# Patient Record
Sex: Female | Born: 1955
Health system: Southern US, Community
[De-identification: ages and names within clinical notes are randomized; demographics above are authoritative.]

## PROBLEM LIST (undated history)

## (undated) DIAGNOSIS — C44722 Squamous cell carcinoma of skin of right lower limb, including hip: Secondary | ICD-10-CM

## (undated) DIAGNOSIS — G459 Transient cerebral ischemic attack, unspecified: Secondary | ICD-10-CM

## (undated) DIAGNOSIS — M549 Dorsalgia, unspecified: Secondary | ICD-10-CM

## (undated) DIAGNOSIS — T7840XA Allergy, unspecified, initial encounter: Secondary | ICD-10-CM

## (undated) DIAGNOSIS — C801 Malignant (primary) neoplasm, unspecified: Secondary | ICD-10-CM

## (undated) DIAGNOSIS — G8929 Other chronic pain: Secondary | ICD-10-CM

## (undated) HISTORY — DX: Allergy, unspecified, initial encounter: T78.40XA

## (undated) HISTORY — PX: CHOLECYSTECTOMY: SHX55

## (undated) HISTORY — PX: FOOT SURGERY: SHX648

## (undated) HISTORY — DX: Squamous cell carcinoma of skin of right lower limb, including hip: C44.722

## (undated) HISTORY — PX: HIP SURGERY: SHX245

## (undated) HISTORY — PX: ABDOMINAL HYSTERECTOMY: SHX81

## (undated) HISTORY — PX: TONSILLECTOMY: SUR1361

## (undated) HISTORY — PX: COLONOSCOPY: SHX174

## (undated) HISTORY — DX: Malignant (primary) neoplasm, unspecified: C80.1

## (undated) HISTORY — PX: WISDOM TOOTH EXTRACTION: SHX21

## (undated) HISTORY — PX: BACK SURGERY: SHX140

---

## 2009-08-17 ENCOUNTER — Emergency Department (HOSPITAL_COMMUNITY): Admission: EM | Admit: 2009-08-17 | Discharge: 2009-08-17 | Payer: Self-pay | Admitting: Emergency Medicine

## 2009-09-09 ENCOUNTER — Encounter: Admission: RE | Admit: 2009-09-09 | Discharge: 2009-09-09 | Payer: Self-pay | Admitting: Neurosurgery

## 2010-05-25 ENCOUNTER — Encounter: Payer: Self-pay | Admitting: Neurosurgery

## 2012-02-10 DIAGNOSIS — M5412 Radiculopathy, cervical region: Secondary | ICD-10-CM

## 2012-02-10 HISTORY — DX: Radiculopathy, cervical region: M54.12

## 2012-04-11 DIAGNOSIS — M25559 Pain in unspecified hip: Secondary | ICD-10-CM

## 2012-04-11 DIAGNOSIS — M48061 Spinal stenosis, lumbar region without neurogenic claudication: Secondary | ICD-10-CM

## 2012-04-11 DIAGNOSIS — M4310 Spondylolisthesis, site unspecified: Secondary | ICD-10-CM

## 2012-04-11 DIAGNOSIS — M549 Dorsalgia, unspecified: Secondary | ICD-10-CM

## 2012-04-11 DIAGNOSIS — IMO0002 Reserved for concepts with insufficient information to code with codable children: Secondary | ICD-10-CM

## 2012-04-11 HISTORY — DX: Pain in unspecified hip: M25.559

## 2012-04-11 HISTORY — DX: Dorsalgia, unspecified: M54.9

## 2012-04-11 HISTORY — DX: Reserved for concepts with insufficient information to code with codable children: IMO0002

## 2012-04-11 HISTORY — DX: Spondylolisthesis, site unspecified: M43.10

## 2012-04-11 HISTORY — DX: Spinal stenosis, lumbar region without neurogenic claudication: M48.061

## 2014-02-27 ENCOUNTER — Emergency Department (HOSPITAL_COMMUNITY)
Admission: EM | Admit: 2014-02-27 | Discharge: 2014-02-28 | Disposition: A | Discharge status: Home / Self Care | Payer: Commercial Managed Care - HMO | Attending: Emergency Medicine | Admitting: Emergency Medicine

## 2014-02-27 ENCOUNTER — Emergency Department (HOSPITAL_COMMUNITY): Payer: Commercial Managed Care - HMO

## 2014-02-27 ENCOUNTER — Encounter (HOSPITAL_COMMUNITY): Payer: Self-pay | Admitting: Emergency Medicine

## 2014-02-27 DIAGNOSIS — Z8673 Personal history of transient ischemic attack (TIA), and cerebral infarction without residual deficits: Secondary | ICD-10-CM | POA: Insufficient documentation

## 2014-02-27 DIAGNOSIS — R0602 Shortness of breath: Secondary | ICD-10-CM

## 2014-02-27 DIAGNOSIS — G8929 Other chronic pain: Secondary | ICD-10-CM | POA: Insufficient documentation

## 2014-02-27 DIAGNOSIS — H538 Other visual disturbances: Secondary | ICD-10-CM | POA: Diagnosis present

## 2014-02-27 DIAGNOSIS — R251 Tremor, unspecified: Secondary | ICD-10-CM | POA: Insufficient documentation

## 2014-02-27 DIAGNOSIS — R6884 Jaw pain: Secondary | ICD-10-CM

## 2014-02-27 DIAGNOSIS — H547 Unspecified visual loss: Secondary | ICD-10-CM | POA: Insufficient documentation

## 2014-02-27 DIAGNOSIS — Z720 Tobacco use: Secondary | ICD-10-CM | POA: Diagnosis not present

## 2014-02-27 HISTORY — DX: Other chronic pain: G89.29

## 2014-02-27 HISTORY — DX: Other chronic pain: M54.9

## 2014-02-27 HISTORY — DX: Transient cerebral ischemic attack, unspecified: G45.9

## 2014-02-27 LAB — URINALYSIS, ROUTINE W REFLEX MICROSCOPIC
Bilirubin Urine: NEGATIVE
GLUCOSE, UA: NEGATIVE mg/dL
Hgb urine dipstick: NEGATIVE
KETONES UR: NEGATIVE mg/dL
LEUKOCYTES UA: NEGATIVE
NITRITE: NEGATIVE
PH: 6 (ref 5.0–8.0)
PROTEIN: NEGATIVE mg/dL
SPECIFIC GRAVITY, URINE: 1.008 (ref 1.005–1.030)
UROBILINOGEN UA: 0.2 mg/dL (ref 0.0–1.0)

## 2014-02-27 LAB — BASIC METABOLIC PANEL
Anion gap: 14 (ref 5–15)
BUN: 12 mg/dL (ref 6–23)
CHLORIDE: 104 meq/L (ref 96–112)
CO2: 25 mEq/L (ref 19–32)
Calcium: 9.8 mg/dL (ref 8.4–10.5)
Creatinine, Ser: 0.63 mg/dL (ref 0.50–1.10)
GFR calc Af Amer: >90 mL/min (ref >90)
GFR calc non Af Amer: >90 mL/min (ref >90)
Glucose, Bld: 101 mg/dL — ABNORMAL HIGH (ref 70–99)
POTASSIUM: 4.5 meq/L (ref 3.7–5.3)
SODIUM: 143 meq/L (ref 137–147)

## 2014-02-27 LAB — CBC
HCT: 43.9 % (ref 36.0–46.0)
Hemoglobin: 15.2 g/dL — ABNORMAL HIGH (ref 12.0–15.0)
MCH: 31.3 pg (ref 26.0–34.0)
MCHC: 34.6 g/dL (ref 30.0–36.0)
MCV: 90.3 fL (ref 78.0–100.0)
PLATELETS: 354 10*3/uL (ref 150–400)
RBC: 4.86 MIL/uL (ref 3.87–5.11)
RDW: 13.2 % (ref 11.5–15.5)
WBC: 9 10*3/uL (ref 4.0–10.5)

## 2014-02-27 NOTE — ED Provider Notes (Signed)
CSN: [id number]     Arrival date & time 02/27/14  1647 History   First MD Initiated Contact with Patient 02/27/14 2011     Chief Complaint  Patient presents with  . Blurred Vision     HPI  Patient presents with concern of blurred vision. Symptoms began 2 weeks ago without clear precipitant. Since onset patient has had bilateral visual disturbance.  There is no ocular pain, nor headache. There is no neck pain, though the patient notes that there is discomfort with jaw opening on the L near the anterior auricular area. No other neck pain. Patient has a Hx of prior TIA - no current anti-platelet meds. Patient also has SOB, though this seems chronic and unchanged.  Patient smokes.  Smoking cessation counselling provided    Past Medical History  Diagnosis Date  . Chronic back pain   . Transient ischemic attack (TIA)    Past Surgical History  Procedure Laterality Date  . Back surgery    . Abdominal hysterectomy    . Foot surgery    . Cholecystectomy     No family history on file. History  Substance Use Topics  . Smoking status: Current Every Day Smoker  . Smokeless tobacco: Not on file  . Alcohol Use: No   OB History   Grav Para Term Preterm Abortions TAB SAB Ect Mult Living                 Review of Systems  Constitutional:       Per HPI, otherwise negative  HENT:       Per HPI, otherwise negative  Eyes: Positive for visual disturbance. Negative for photophobia, pain, discharge, redness and itching.       Husband states that the patient's eyes twitch  Respiratory:       Per HPI, otherwise negative  Cardiovascular:       Per HPI, otherwise negative  Gastrointestinal: Negative for vomiting.  Endocrine:       Negative aside from HPI  Genitourinary:       Neg aside from HPI   Musculoskeletal:       Chronic L inguinal pain - unchanged  Skin: Negative.   Neurological: Positive for tremors. Negative for dizziness, seizures, syncope, facial asymmetry, speech  difficulty, weakness, light-headedness, numbness and headaches.      Allergies  Oxycodone  Home Medications   Prior to Admission medications   Not on File   BP 142/84  Pulse 87  Temp(Src) 98.1 F (36.7 C) (Oral)  Resp 18  Ht 5\' 2"  (1.575 m)  Wt 126 lb (57.153 kg)  BMI 23.04 kg/m2  SpO2 99% Physical Exam  Nursing note and vitals reviewed. Constitutional: She is oriented to person, place, and time. She appears well-developed and well-nourished. No distress.  HENT:  Head: Normocephalic and atraumatic.  Eyes: Conjunctivae are normal. Pupils are equal, round, and reactive to light.  Patient tracks minimally, spontaneously. With directed visual exam, patient has quick development of dizziness.  Horizontal and vertical motion.  Neck:    Cardiovascular: Normal rate and regular rhythm.   Pulmonary/Chest: Effort normal and breath sounds normal. No stridor. No respiratory distress.  Abdominal: She exhibits no distension.  Musculoskeletal: She exhibits no edema.  Neurological: She is alert and oriented to person, place, and time. No cranial nerve deficit.  NIH stroke scale 0. Patient's vision is objectively normal, but she complains of blurriness, difficulty focusing on moving objects  Skin: Skin is warm and dry.  Psychiatric: She has a normal mood and affect.    ED Course  Procedures (including critical care time) Labs Review Labs Reviewed  BASIC METABOLIC PANEL - Abnormal; Notable for the following:    Glucose, Bld 101 (*)    All other components within normal limits  CBC - Abnormal; Notable for the following:    Hemoglobin 15.2 (*)    All other components within normal limits  URINALYSIS, ROUTINE W REFLEX MICROSCOPIC    Imaging Review Dg Chest 2 View  02/27/2014   CLINICAL DATA:  Blurry vision and intermittent shortness of breath for several weeks.  EXAM: CHEST  2 VIEW  COMPARISON:  None.  FINDINGS: The heart size and mediastinal contours are within normal limits.  Both lungs are clear. The visualized skeletal structures are unremarkable.  IMPRESSION: No active cardiopulmonary disease.   Electronically Signed   By: Kerby Moors M.D.   On: 02/27/2014 18:09     EKG Interpretation   Date/Time:  Tuesday February 27 2014 17:19:36 EDT Ventricular Rate:  85 PR Interval:  147 QRS Duration: 94 QT Interval:  365 QTC Calculation: 434 R Axis:   88 Text Interpretation:  Sinus rhythm NORMAL Confirmed by Johnney Killian, MD, Jeannie Done  [phone]) on 02/27/2014 6:05:44 PM     11:47 PM Patient awake and alert, in no distress. No new complaints. Patient and husband aware of all results.  She will follow up with her neurologist and ophthalmology tomorrow. MDM   Final diagnoses:  Visual loss    Patient presents with 1 month of ongoing visual changes.  Patient is awake and alert, in no distress here. Patient also has several ancillary complaints. Patient is awake, alert, neurologically intact, hemodynamically stable, with a largely reassuring physical exam, labs, MRI. Given the patient's evidence of prior stroke, I discussed stroke prophylaxis with her, pending outpatient neurology, ophthalmology follow-up. Patient states that she is intolerant of substantial anticoagulants, but will take 81 mg aspirin daily. Patient discharged in stable condition to follow-up as an outpatient.    Carmin Muskrat, MD 02/27/14 2351

## 2014-02-27 NOTE — Discharge Instructions (Signed)
As discussed, tonight's evaluation has been largely reassuring.  However, your visual disturbance can be considered a work in progress, and additional evaluation is necessary.  Please be sure to speak with your physician and our ophthalmologist tomorrow.  Return here for concerning changes in your condition.  Please add aspirin, 81 mg, daily to your medication regimen.   Blurred Vision You have been seen today complaining of blurred vision. This means you have a loss of ability to see small details.  CAUSES  Blurred vision can be a symptom of underlying eye problems, such as:  Aging of the eye (presbyopia).  Glaucoma.  Cataracts.  Eye infection.  Eye-related migraine.  Diabetes mellitus.  Fatigue.  Migraine headaches.  High blood pressure.  Breakdown of the back of the eye (macular degeneration).  Problems caused by some medications. The most common cause of blurred vision is the need for eyeglasses or a new prescription. Today in the emergency department, no cause for your blurred vision can be found. SYMPTOMS  Blurred vision is the loss of visual sharpness and detail (acuity). DIAGNOSIS  Should blurred vision continue, you should see your caregiver. If your caregiver is your primary care physician, he or she may choose to refer you to another specialist.  TREATMENT  Do not ignore your blurred vision. Make sure to have it checked out to see if further treatment or referral is necessary. SEEK MEDICAL CARE IF:  You are unable to get into a specialist so we can help you with a referral. SEEK IMMEDIATE MEDICAL CARE IF: You have severe eye pain, severe headache, or sudden loss of vision. MAKE SURE YOU:   Understand these instructions.  Will watch your condition.  Will get help right away if you are not doing well or get worse. Document Released: 04/23/2003 Document Revised: 07/13/2011 Document Reviewed: 11/23/2007 [facility] Patient Information 2015 [place], [place]. This  information is not intended to replace advice given to you by your health care provider. Make sure you discuss any questions you have with your health care provider.

## 2014-02-27 NOTE — ED Notes (Signed)
Pt reports blurry vision and intermittent shortness of breath for a "couple of weeks" & c/o pain just under left ear for several months,

## 2014-02-27 NOTE — ED Notes (Signed)
Pt remains in MRI 

## 2014-02-27 NOTE — ED Notes (Signed)
Patient states she has been experiencing blurry, vision, and anxious for the last month. Patient states "for a while", she reports she has pain with touch, knot underneath her left ear.

## 2014-02-27 NOTE — ED Notes (Signed)
Pt transported to MRI 

## 2014-02-27 NOTE — Progress Notes (Signed)
  CARE MANAGEMENT ED NOTE 02/27/2014  Patient:  Nancy Chavez   Account Number:  [id number]  Date Initiated:  02/27/2014  Documentation initiated by:  Nancy Chavez  Subjective/Objective Assessment:   Patient presents to Ed with blurry vision sob for a couple of weeks and pain under left ear     Subjective/Objective Assessment Detail:   Patient with pmhx of TIA     Action/Plan:   Action/Plan Detail:   Anticipated DC Date:       Status Recommendation to Physician:   Result of Recommendation:    Other ED [place]  Other  PCP issues    Choice offered to / List presented to:            Status of service:  Completed, signed off  ED Comments:   ED Comments Detail:  EDCM spoke to patient at bedside.  Patient confirms her pcp is Nancy Chavez at [facility] in [place]. System updated.

## 2014-05-10 DIAGNOSIS — G5601 Carpal tunnel syndrome, right upper limb: Secondary | ICD-10-CM | POA: Diagnosis not present

## 2014-05-10 DIAGNOSIS — G5602 Carpal tunnel syndrome, left upper limb: Secondary | ICD-10-CM | POA: Diagnosis not present

## 2014-05-10 DIAGNOSIS — Z8673 Personal history of transient ischemic attack (TIA), and cerebral infarction without residual deficits: Secondary | ICD-10-CM | POA: Diagnosis not present

## 2014-05-10 DIAGNOSIS — G44209 Tension-type headache, unspecified, not intractable: Secondary | ICD-10-CM | POA: Diagnosis not present

## 2014-05-10 DIAGNOSIS — G4701 Insomnia due to medical condition: Secondary | ICD-10-CM | POA: Diagnosis not present

## 2014-05-10 DIAGNOSIS — M542 Cervicalgia: Secondary | ICD-10-CM | POA: Diagnosis not present

## 2014-05-10 DIAGNOSIS — M5417 Radiculopathy, lumbosacral region: Secondary | ICD-10-CM | POA: Diagnosis not present

## 2014-05-10 DIAGNOSIS — M5412 Radiculopathy, cervical region: Secondary | ICD-10-CM | POA: Diagnosis not present

## 2014-05-18 DIAGNOSIS — M5416 Radiculopathy, lumbar region: Secondary | ICD-10-CM | POA: Diagnosis not present

## 2014-05-18 DIAGNOSIS — Z8673 Personal history of transient ischemic attack (TIA), and cerebral infarction without residual deficits: Secondary | ICD-10-CM | POA: Diagnosis not present

## 2014-05-18 DIAGNOSIS — G44209 Tension-type headache, unspecified, not intractable: Secondary | ICD-10-CM | POA: Diagnosis not present

## 2014-05-18 DIAGNOSIS — G5601 Carpal tunnel syndrome, right upper limb: Secondary | ICD-10-CM | POA: Diagnosis not present

## 2014-05-18 DIAGNOSIS — M5412 Radiculopathy, cervical region: Secondary | ICD-10-CM | POA: Diagnosis not present

## 2014-08-02 DIAGNOSIS — M5412 Radiculopathy, cervical region: Secondary | ICD-10-CM | POA: Diagnosis not present

## 2014-08-02 DIAGNOSIS — G5602 Carpal tunnel syndrome, left upper limb: Secondary | ICD-10-CM | POA: Diagnosis not present

## 2014-08-02 DIAGNOSIS — G5601 Carpal tunnel syndrome, right upper limb: Secondary | ICD-10-CM | POA: Diagnosis not present

## 2014-08-02 DIAGNOSIS — M5417 Radiculopathy, lumbosacral region: Secondary | ICD-10-CM | POA: Diagnosis not present

## 2014-08-02 DIAGNOSIS — Z8673 Personal history of transient ischemic attack (TIA), and cerebral infarction without residual deficits: Secondary | ICD-10-CM | POA: Diagnosis not present

## 2014-09-03 DIAGNOSIS — G5602 Carpal tunnel syndrome, left upper limb: Secondary | ICD-10-CM | POA: Diagnosis not present

## 2014-09-03 DIAGNOSIS — M129 Arthropathy, unspecified: Secondary | ICD-10-CM | POA: Diagnosis not present

## 2014-09-03 DIAGNOSIS — M1811 Unilateral primary osteoarthritis of first carpometacarpal joint, right hand: Secondary | ICD-10-CM

## 2014-09-03 DIAGNOSIS — M189 Osteoarthritis of first carpometacarpal joint, unspecified: Secondary | ICD-10-CM | POA: Diagnosis not present

## 2014-09-03 DIAGNOSIS — M13831 Other specified arthritis, right wrist: Secondary | ICD-10-CM | POA: Diagnosis not present

## 2014-09-03 DIAGNOSIS — F17210 Nicotine dependence, cigarettes, uncomplicated: Secondary | ICD-10-CM | POA: Diagnosis not present

## 2014-09-03 DIAGNOSIS — G5601 Carpal tunnel syndrome, right upper limb: Secondary | ICD-10-CM | POA: Insufficient documentation

## 2014-09-03 DIAGNOSIS — M19031 Primary osteoarthritis, right wrist: Secondary | ICD-10-CM | POA: Diagnosis not present

## 2014-09-03 HISTORY — DX: Carpal tunnel syndrome, right upper limb: G56.01

## 2014-09-03 HISTORY — DX: Unilateral primary osteoarthritis of first carpometacarpal joint, right hand: M18.11

## 2014-09-28 DIAGNOSIS — G5602 Carpal tunnel syndrome, left upper limb: Secondary | ICD-10-CM | POA: Diagnosis not present

## 2014-09-28 DIAGNOSIS — G5601 Carpal tunnel syndrome, right upper limb: Secondary | ICD-10-CM | POA: Diagnosis not present

## 2014-11-14 DIAGNOSIS — Z8673 Personal history of transient ischemic attack (TIA), and cerebral infarction without residual deficits: Secondary | ICD-10-CM | POA: Diagnosis not present

## 2014-11-14 DIAGNOSIS — M5481 Occipital neuralgia: Secondary | ICD-10-CM | POA: Diagnosis not present

## 2014-11-14 DIAGNOSIS — M5416 Radiculopathy, lumbar region: Secondary | ICD-10-CM | POA: Diagnosis not present

## 2014-11-14 DIAGNOSIS — G44209 Tension-type headache, unspecified, not intractable: Secondary | ICD-10-CM | POA: Diagnosis not present

## 2014-11-14 DIAGNOSIS — M5412 Radiculopathy, cervical region: Secondary | ICD-10-CM | POA: Diagnosis not present

## 2014-12-24 DIAGNOSIS — Z8673 Personal history of transient ischemic attack (TIA), and cerebral infarction without residual deficits: Secondary | ICD-10-CM | POA: Insufficient documentation

## 2014-12-24 HISTORY — DX: Personal history of transient ischemic attack (TIA), and cerebral infarction without residual deficits: Z86.73

## 2014-12-26 DIAGNOSIS — Z885 Allergy status to narcotic agent status: Secondary | ICD-10-CM | POA: Diagnosis not present

## 2014-12-26 DIAGNOSIS — M19031 Primary osteoarthritis, right wrist: Secondary | ICD-10-CM | POA: Diagnosis not present

## 2014-12-26 DIAGNOSIS — G5601 Carpal tunnel syndrome, right upper limb: Secondary | ICD-10-CM | POA: Diagnosis not present

## 2014-12-26 DIAGNOSIS — G5602 Carpal tunnel syndrome, left upper limb: Secondary | ICD-10-CM | POA: Diagnosis not present

## 2014-12-26 DIAGNOSIS — F329 Major depressive disorder, single episode, unspecified: Secondary | ICD-10-CM | POA: Diagnosis not present

## 2014-12-26 DIAGNOSIS — F17210 Nicotine dependence, cigarettes, uncomplicated: Secondary | ICD-10-CM | POA: Diagnosis not present

## 2014-12-26 DIAGNOSIS — F419 Anxiety disorder, unspecified: Secondary | ICD-10-CM | POA: Diagnosis not present

## 2014-12-26 DIAGNOSIS — M1811 Unilateral primary osteoarthritis of first carpometacarpal joint, right hand: Secondary | ICD-10-CM | POA: Diagnosis not present

## 2014-12-26 DIAGNOSIS — G8929 Other chronic pain: Secondary | ICD-10-CM | POA: Diagnosis not present

## 2014-12-26 DIAGNOSIS — M549 Dorsalgia, unspecified: Secondary | ICD-10-CM | POA: Diagnosis not present

## 2014-12-31 DIAGNOSIS — M25649 Stiffness of unspecified hand, not elsewhere classified: Secondary | ICD-10-CM | POA: Diagnosis not present

## 2014-12-31 DIAGNOSIS — Z9889 Other specified postprocedural states: Secondary | ICD-10-CM | POA: Diagnosis not present

## 2014-12-31 DIAGNOSIS — M79644 Pain in right finger(s): Secondary | ICD-10-CM | POA: Diagnosis not present

## 2015-01-04 DIAGNOSIS — M5412 Radiculopathy, cervical region: Secondary | ICD-10-CM | POA: Diagnosis not present

## 2015-01-04 DIAGNOSIS — N393 Stress incontinence (female) (male): Secondary | ICD-10-CM | POA: Diagnosis not present

## 2015-01-04 DIAGNOSIS — E782 Mixed hyperlipidemia: Secondary | ICD-10-CM | POA: Diagnosis not present

## 2015-01-04 DIAGNOSIS — M5416 Radiculopathy, lumbar region: Secondary | ICD-10-CM | POA: Diagnosis not present

## 2015-01-04 DIAGNOSIS — Z8673 Personal history of transient ischemic attack (TIA), and cerebral infarction without residual deficits: Secondary | ICD-10-CM | POA: Diagnosis not present

## 2015-01-04 DIAGNOSIS — Z13820 Encounter for screening for osteoporosis: Secondary | ICD-10-CM | POA: Diagnosis not present

## 2015-01-04 DIAGNOSIS — R7309 Other abnormal glucose: Secondary | ICD-10-CM | POA: Diagnosis not present

## 2015-01-04 DIAGNOSIS — Z0000 Encounter for general adult medical examination without abnormal findings: Secondary | ICD-10-CM | POA: Diagnosis not present

## 2015-01-11 DIAGNOSIS — Z1211 Encounter for screening for malignant neoplasm of colon: Secondary | ICD-10-CM | POA: Diagnosis not present

## 2015-01-28 DIAGNOSIS — M1811 Unilateral primary osteoarthritis of first carpometacarpal joint, right hand: Secondary | ICD-10-CM | POA: Diagnosis not present

## 2015-01-28 DIAGNOSIS — M25649 Stiffness of unspecified hand, not elsewhere classified: Secondary | ICD-10-CM | POA: Diagnosis not present

## 2015-01-28 DIAGNOSIS — M85841 Other specified disorders of bone density and structure, right hand: Secondary | ICD-10-CM | POA: Diagnosis not present

## 2015-01-28 DIAGNOSIS — M79644 Pain in right finger(s): Secondary | ICD-10-CM | POA: Diagnosis not present

## 2015-01-28 DIAGNOSIS — Z4789 Encounter for other orthopedic aftercare: Secondary | ICD-10-CM | POA: Diagnosis not present

## 2015-02-13 DIAGNOSIS — M5481 Occipital neuralgia: Secondary | ICD-10-CM | POA: Diagnosis not present

## 2015-02-13 DIAGNOSIS — M5412 Radiculopathy, cervical region: Secondary | ICD-10-CM | POA: Diagnosis not present

## 2015-02-13 DIAGNOSIS — M5441 Lumbago with sciatica, right side: Secondary | ICD-10-CM | POA: Diagnosis not present

## 2015-02-13 DIAGNOSIS — M5442 Lumbago with sciatica, left side: Secondary | ICD-10-CM | POA: Diagnosis not present

## 2015-02-13 DIAGNOSIS — Z8673 Personal history of transient ischemic attack (TIA), and cerebral infarction without residual deficits: Secondary | ICD-10-CM | POA: Diagnosis not present

## 2015-02-13 DIAGNOSIS — G44209 Tension-type headache, unspecified, not intractable: Secondary | ICD-10-CM | POA: Diagnosis not present

## 2015-02-27 DIAGNOSIS — R110 Nausea: Secondary | ICD-10-CM | POA: Diagnosis not present

## 2015-02-27 DIAGNOSIS — R634 Abnormal weight loss: Secondary | ICD-10-CM | POA: Diagnosis not present

## 2015-05-07 DIAGNOSIS — Z780 Asymptomatic menopausal state: Secondary | ICD-10-CM | POA: Diagnosis not present

## 2015-05-07 DIAGNOSIS — N951 Menopausal and female climacteric states: Secondary | ICD-10-CM | POA: Diagnosis not present

## 2015-05-07 DIAGNOSIS — R5383 Other fatigue: Secondary | ICD-10-CM | POA: Diagnosis not present

## 2015-05-07 DIAGNOSIS — L298 Other pruritus: Secondary | ICD-10-CM | POA: Diagnosis not present

## 2015-05-15 DIAGNOSIS — G5601 Carpal tunnel syndrome, right upper limb: Secondary | ICD-10-CM | POA: Diagnosis not present

## 2015-05-15 DIAGNOSIS — M542 Cervicalgia: Secondary | ICD-10-CM | POA: Diagnosis not present

## 2015-05-15 DIAGNOSIS — M5442 Lumbago with sciatica, left side: Secondary | ICD-10-CM | POA: Diagnosis not present

## 2015-05-15 DIAGNOSIS — G5602 Carpal tunnel syndrome, left upper limb: Secondary | ICD-10-CM | POA: Diagnosis not present

## 2015-05-15 DIAGNOSIS — M5441 Lumbago with sciatica, right side: Secondary | ICD-10-CM | POA: Diagnosis not present

## 2015-05-15 DIAGNOSIS — Z8673 Personal history of transient ischemic attack (TIA), and cerebral infarction without residual deficits: Secondary | ICD-10-CM | POA: Diagnosis not present

## 2015-05-15 DIAGNOSIS — M5412 Radiculopathy, cervical region: Secondary | ICD-10-CM | POA: Diagnosis not present

## 2015-06-19 DIAGNOSIS — Z1389 Encounter for screening for other disorder: Secondary | ICD-10-CM | POA: Diagnosis not present

## 2015-06-19 DIAGNOSIS — E1165 Type 2 diabetes mellitus with hyperglycemia: Secondary | ICD-10-CM | POA: Diagnosis not present

## 2015-06-19 DIAGNOSIS — E782 Mixed hyperlipidemia: Secondary | ICD-10-CM | POA: Diagnosis not present

## 2015-06-20 DIAGNOSIS — G4701 Insomnia due to medical condition: Secondary | ICD-10-CM | POA: Diagnosis not present

## 2015-06-20 DIAGNOSIS — G44209 Tension-type headache, unspecified, not intractable: Secondary | ICD-10-CM | POA: Diagnosis not present

## 2015-06-20 DIAGNOSIS — G5601 Carpal tunnel syndrome, right upper limb: Secondary | ICD-10-CM | POA: Diagnosis not present

## 2015-06-20 DIAGNOSIS — G5602 Carpal tunnel syndrome, left upper limb: Secondary | ICD-10-CM | POA: Diagnosis not present

## 2015-06-20 DIAGNOSIS — M5481 Occipital neuralgia: Secondary | ICD-10-CM | POA: Diagnosis not present

## 2015-06-20 DIAGNOSIS — M5412 Radiculopathy, cervical region: Secondary | ICD-10-CM | POA: Diagnosis not present

## 2015-06-20 DIAGNOSIS — Z8673 Personal history of transient ischemic attack (TIA), and cerebral infarction without residual deficits: Secondary | ICD-10-CM | POA: Diagnosis not present

## 2015-06-20 DIAGNOSIS — M5417 Radiculopathy, lumbosacral region: Secondary | ICD-10-CM | POA: Diagnosis not present

## 2015-06-24 DIAGNOSIS — N63 Unspecified lump in breast: Secondary | ICD-10-CM | POA: Diagnosis not present

## 2015-07-10 DIAGNOSIS — R110 Nausea: Secondary | ICD-10-CM | POA: Diagnosis not present

## 2015-07-10 DIAGNOSIS — R634 Abnormal weight loss: Secondary | ICD-10-CM | POA: Diagnosis not present

## 2015-07-12 DIAGNOSIS — M1990 Unspecified osteoarthritis, unspecified site: Secondary | ICD-10-CM | POA: Diagnosis not present

## 2015-07-12 DIAGNOSIS — Z9049 Acquired absence of other specified parts of digestive tract: Secondary | ICD-10-CM | POA: Diagnosis not present

## 2015-07-12 DIAGNOSIS — F17210 Nicotine dependence, cigarettes, uncomplicated: Secondary | ICD-10-CM | POA: Diagnosis not present

## 2015-07-12 DIAGNOSIS — R634 Abnormal weight loss: Secondary | ICD-10-CM | POA: Diagnosis not present

## 2015-07-12 DIAGNOSIS — Z6820 Body mass index (BMI) 20.0-20.9, adult: Secondary | ICD-10-CM | POA: Diagnosis not present

## 2015-07-12 DIAGNOSIS — R110 Nausea: Secondary | ICD-10-CM | POA: Diagnosis not present

## 2015-07-12 DIAGNOSIS — Z8673 Personal history of transient ischemic attack (TIA), and cerebral infarction without residual deficits: Secondary | ICD-10-CM | POA: Diagnosis not present

## 2015-07-12 DIAGNOSIS — Z79899 Other long term (current) drug therapy: Secondary | ICD-10-CM | POA: Diagnosis not present

## 2015-07-12 DIAGNOSIS — Z86010 Personal history of colonic polyps: Secondary | ICD-10-CM | POA: Diagnosis not present

## 2015-07-12 DIAGNOSIS — K449 Diaphragmatic hernia without obstruction or gangrene: Secondary | ICD-10-CM | POA: Diagnosis not present

## 2015-07-17 DIAGNOSIS — M545 Low back pain: Secondary | ICD-10-CM | POA: Diagnosis not present

## 2015-07-17 DIAGNOSIS — K529 Noninfective gastroenteritis and colitis, unspecified: Secondary | ICD-10-CM | POA: Diagnosis not present

## 2015-07-17 DIAGNOSIS — K5730 Diverticulosis of large intestine without perforation or abscess without bleeding: Secondary | ICD-10-CM | POA: Diagnosis not present

## 2015-07-19 DIAGNOSIS — Z1389 Encounter for screening for other disorder: Secondary | ICD-10-CM | POA: Diagnosis not present

## 2015-07-19 DIAGNOSIS — K529 Noninfective gastroenteritis and colitis, unspecified: Secondary | ICD-10-CM | POA: Diagnosis not present

## 2015-07-19 DIAGNOSIS — E1165 Type 2 diabetes mellitus with hyperglycemia: Secondary | ICD-10-CM | POA: Diagnosis not present

## 2015-07-19 DIAGNOSIS — R634 Abnormal weight loss: Secondary | ICD-10-CM | POA: Diagnosis not present

## 2015-07-19 DIAGNOSIS — Z720 Tobacco use: Secondary | ICD-10-CM | POA: Diagnosis not present

## 2015-08-05 DIAGNOSIS — F4323 Adjustment disorder with mixed anxiety and depressed mood: Secondary | ICD-10-CM | POA: Diagnosis not present

## 2015-08-05 DIAGNOSIS — R5383 Other fatigue: Secondary | ICD-10-CM | POA: Diagnosis not present

## 2015-08-05 DIAGNOSIS — R634 Abnormal weight loss: Secondary | ICD-10-CM | POA: Diagnosis not present

## 2015-08-05 DIAGNOSIS — R5381 Other malaise: Secondary | ICD-10-CM | POA: Diagnosis not present

## 2015-08-05 DIAGNOSIS — N390 Urinary tract infection, site not specified: Secondary | ICD-10-CM | POA: Diagnosis not present

## 2015-08-07 DIAGNOSIS — R634 Abnormal weight loss: Secondary | ICD-10-CM | POA: Diagnosis not present

## 2015-08-07 DIAGNOSIS — R110 Nausea: Secondary | ICD-10-CM | POA: Diagnosis not present

## 2015-08-19 DIAGNOSIS — Z720 Tobacco use: Secondary | ICD-10-CM | POA: Diagnosis not present

## 2015-08-19 DIAGNOSIS — R634 Abnormal weight loss: Secondary | ICD-10-CM | POA: Diagnosis not present

## 2015-08-19 DIAGNOSIS — F419 Anxiety disorder, unspecified: Secondary | ICD-10-CM | POA: Diagnosis not present

## 2015-09-26 DIAGNOSIS — Z8673 Personal history of transient ischemic attack (TIA), and cerebral infarction without residual deficits: Secondary | ICD-10-CM | POA: Diagnosis not present

## 2015-09-26 DIAGNOSIS — M542 Cervicalgia: Secondary | ICD-10-CM | POA: Diagnosis not present

## 2015-09-26 DIAGNOSIS — M5481 Occipital neuralgia: Secondary | ICD-10-CM | POA: Diagnosis not present

## 2015-09-26 DIAGNOSIS — M5441 Lumbago with sciatica, right side: Secondary | ICD-10-CM | POA: Diagnosis not present

## 2015-09-26 DIAGNOSIS — M5442 Lumbago with sciatica, left side: Secondary | ICD-10-CM | POA: Diagnosis not present

## 2015-09-26 DIAGNOSIS — G4701 Insomnia due to medical condition: Secondary | ICD-10-CM | POA: Diagnosis not present

## 2015-09-26 DIAGNOSIS — G5603 Carpal tunnel syndrome, bilateral upper limbs: Secondary | ICD-10-CM | POA: Diagnosis not present

## 2015-10-08 DIAGNOSIS — M5136 Other intervertebral disc degeneration, lumbar region: Secondary | ICD-10-CM | POA: Diagnosis not present

## 2015-10-08 DIAGNOSIS — M4806 Spinal stenosis, lumbar region: Secondary | ICD-10-CM | POA: Diagnosis not present

## 2015-11-07 DIAGNOSIS — Z720 Tobacco use: Secondary | ICD-10-CM | POA: Diagnosis not present

## 2015-11-07 DIAGNOSIS — Z780 Asymptomatic menopausal state: Secondary | ICD-10-CM | POA: Diagnosis not present

## 2015-11-07 DIAGNOSIS — K219 Gastro-esophageal reflux disease without esophagitis: Secondary | ICD-10-CM | POA: Diagnosis not present

## 2015-11-07 DIAGNOSIS — F419 Anxiety disorder, unspecified: Secondary | ICD-10-CM | POA: Diagnosis not present

## 2015-11-07 DIAGNOSIS — E782 Mixed hyperlipidemia: Secondary | ICD-10-CM | POA: Diagnosis not present

## 2015-11-07 DIAGNOSIS — E1165 Type 2 diabetes mellitus with hyperglycemia: Secondary | ICD-10-CM | POA: Diagnosis not present

## 2015-11-07 DIAGNOSIS — Z1389 Encounter for screening for other disorder: Secondary | ICD-10-CM | POA: Diagnosis not present

## 2015-12-19 DIAGNOSIS — Z8673 Personal history of transient ischemic attack (TIA), and cerebral infarction without residual deficits: Secondary | ICD-10-CM | POA: Diagnosis not present

## 2015-12-19 DIAGNOSIS — G44209 Tension-type headache, unspecified, not intractable: Secondary | ICD-10-CM | POA: Diagnosis not present

## 2015-12-19 DIAGNOSIS — G5603 Carpal tunnel syndrome, bilateral upper limbs: Secondary | ICD-10-CM | POA: Diagnosis not present

## 2015-12-19 DIAGNOSIS — M542 Cervicalgia: Secondary | ICD-10-CM | POA: Diagnosis not present

## 2015-12-19 DIAGNOSIS — G4701 Insomnia due to medical condition: Secondary | ICD-10-CM | POA: Diagnosis not present

## 2015-12-19 DIAGNOSIS — M5442 Lumbago with sciatica, left side: Secondary | ICD-10-CM | POA: Diagnosis not present

## 2015-12-19 DIAGNOSIS — M5441 Lumbago with sciatica, right side: Secondary | ICD-10-CM | POA: Diagnosis not present

## 2015-12-24 ENCOUNTER — Ambulatory Visit (INDEPENDENT_AMBULATORY_CARE_PROVIDER_SITE_OTHER): Payer: PPO | Admitting: Endocrinology

## 2015-12-24 ENCOUNTER — Encounter: Payer: Self-pay | Admitting: Endocrinology

## 2015-12-24 VITALS — BP 126/70 | HR 85 | Ht 62.0 in | Wt 113.0 lb

## 2015-12-24 DIAGNOSIS — R7303 Prediabetes: Secondary | ICD-10-CM | POA: Diagnosis not present

## 2015-12-24 DIAGNOSIS — R634 Abnormal weight loss: Secondary | ICD-10-CM | POA: Diagnosis not present

## 2015-12-24 DIAGNOSIS — N951 Menopausal and female climacteric states: Secondary | ICD-10-CM | POA: Diagnosis not present

## 2015-12-24 LAB — COMPREHENSIVE METABOLIC PANEL
ALBUMIN: 4.4 g/dL (ref 3.5–5.2)
ALK PHOS: 65 U/L (ref 39–117)
ALT: 22 U/L (ref 0–35)
AST: 13 U/L (ref 0–37)
BUN: 17 mg/dL (ref 6–23)
CHLORIDE: 102 meq/L (ref 96–112)
CO2: 29 mEq/L (ref 19–32)
Calcium: 9.6 mg/dL (ref 8.4–10.5)
Creatinine, Ser: 0.66 mg/dL (ref 0.40–1.20)
GFR: 97.86 mL/min (ref >60.00)
Glucose, Bld: 120 mg/dL — ABNORMAL HIGH (ref 70–99)
POTASSIUM: 4.4 meq/L (ref 3.5–5.1)
SODIUM: 137 meq/L (ref 135–145)
TOTAL PROTEIN: 7.2 g/dL (ref 6.0–8.3)
Total Bilirubin: 0.5 mg/dL (ref 0.2–1.2)

## 2015-12-24 LAB — T4, FREE: FREE T4: 0.93 ng/dL (ref 0.60–1.60)

## 2015-12-24 LAB — TSH: TSH: 0.43 u[IU]/mL (ref 0.35–4.50)

## 2015-12-24 MED ORDER — ESTRADIOL 0.05 MG/24HR TD PTWK: 0.0500 mg | MEDICATED_PATCH | TRANSDERMAL | 1 refills | Status: DC

## 2015-12-24 NOTE — Progress Notes (Signed)
Please let patient know that blood glucose was 120, recommend that she cut back on high-fat foods and simple sugars Thyroid levels are fine, continue follow-up with PCP Please fax to PCP

## 2015-12-24 NOTE — Progress Notes (Signed)
Patient ID: Nancy Chavez, female   DOB: 1955-12-08, 60 y.o.   MRN: [id number]            Chief complaint: Hot flashes  Referring physician: Unk Lightning  History of Present Illness:  Patient has been told to be in the menopause for several years, probably after her oophorectomy in 2009 She initially was given Premarin for a year with control of her heart flashes but not clear why this was stopped.  About 8 months ago she started losing weight and lost about 30 pounds.  She was having excessive amounts of stress and anxiety Her appetite was normal. She has been treated with multiple anxiety and depression drugs with improvement in her symptoms She has started gaining back some weight recently She does not complain of any excessive anxiety now  About 6 months ago or so she was started on hormone supplement that was compounded and absorbed buccally.  This apparently contain estradiol, progesterone, testosterone and DHEA This helped her hot flashes and she felt better overall About 6 weeks ago she was seen by her PCP and since her estradiol level was low she was told to stop the hormone supplement and referred here  Currently the patient is complaining about hot flashes and some nocturnal sweating. She does not have unusual fatigue.  She is able to do her daily routine and yard work without difficulties However fatigue has not completely resolved and she may need to take a nap during the day to rest  ?  DIABETES: She was told to have diabetes and was started on metformin when her A1c was 6.4 in 2/17 although her glucose in January was only 110 nonfasting.  She could not tolerate metformin because of GI side effects Recently her glucose has not been rechecked but her A1c was 6.17/17, similar to her level in 9/16 She had a morning glucose of 109 in 2016, not clear if this was fasting  She was told to use liberal diet because of her weight loss this year    Past Medical History:  Diagnosis  Date  . Chronic back pain   . Transient ischemic attack (TIA)     Past Surgical History:  Procedure Laterality Date  . ABDOMINAL HYSTERECTOMY    . BACK SURGERY    . CHOLECYSTECTOMY    . FOOT SURGERY      No family history on file.  Social History:  reports that she has been smoking.  She does not have any smokeless tobacco history on file. She reports that she uses drugs, including Marijuana. She reports that she does not drink alcohol.  Allergies:  Allergies  Allergen Reactions  . Oxycodone Nausea And Vomiting      Medication List       Accurate as of 12/24/15  9:34 AM. Always use your most recent med list.          amitriptyline 25 MG tablet Commonly known as:  ELAVIL Take 75 mg by mouth at bedtime.   atorvastatin 20 MG tablet Commonly known as:  LIPITOR Take 20 mg by mouth daily.   clonazePAM 2 MG tablet Commonly known as:  KLONOPIN Take 2 mg by mouth at bedtime.   diclofenac 75 MG EC tablet Commonly known as:  VOLTAREN Take 75 mg by mouth 2 (two) times daily.   escitalopram 20 MG tablet Commonly known as:  LEXAPRO Take 20 mg by mouth daily.   gabapentin 600 MG tablet Commonly known as:  NEURONTIN Take 600  mg by mouth 3 (three) times daily.   HYDROcodone-acetaminophen 10-325 MG tablet Commonly known as:  NORCO Take 1 tablet by mouth every 6 (six) hours as needed for moderate pain.   ibuprofen 200 MG tablet Commonly known as:  ADVIL,MOTRIN Take 800 mg by mouth every 6 (six) hours as needed for moderate pain.   multivitamin with minerals Tabs tablet Take 1 tablet by mouth daily.   omeprazole 20 MG capsule Commonly known as:  PRILOSEC Take 20 mg by mouth daily.   vitamin A 10000 UNIT capsule Take 10,000 Units by mouth daily.   VITAMIN K (PHYTONADIONE) PO Take 1 tablet by mouth daily.       LABS:  No visits with results within 1 Week(s) from this visit.  Latest known visit with results is:  Admission on 02/27/2014, Discharged on  02/28/2014  Component Date Value Ref Range Status  . Sodium 02/27/2014 143  137 - 147 mEq/L Final  . Potassium 02/27/2014 4.5  3.7 - 5.3 mEq/L Final  . Chloride 02/27/2014 104  96 - 112 mEq/L Final  . CO2 02/27/2014 25  19 - 32 mEq/L Final  . Glucose, Bld 02/27/2014 101* 70 - 99 mg/dL Final  . BUN 02/27/2014 12  6 - 23 mg/dL Final  . Creatinine, Ser 02/27/2014 0.63  0.50 - 1.10 mg/dL Final  . Calcium 02/27/2014 9.8  8.4 - 10.5 mg/dL Final  . GFR calc non Af Amer 02/27/2014 >90  >90 mL/min Final  . GFR calc Af Amer 02/27/2014 >90  >90 mL/min Final   Comment: (NOTE)                          The eGFR has been calculated using the CKD EPI equation.                          This calculation has not been validated in all clinical situations.                          eGFR's persistently <90 mL/min signify possible Chronic Kidney                          Disease.  . Anion gap 02/27/2014 14  5 - 15 Final  . Color, Urine 02/27/2014 YELLOW  YELLOW Final  . APPearance 02/27/2014 CLEAR  CLEAR Final  . Specific Gravity, Urine 02/27/2014 1.008  1.005 - 1.030 Final  . pH 02/27/2014 6.0  5.0 - 8.0 Final  . Glucose, UA 02/27/2014 NEGATIVE  NEGATIVE mg/dL Final  . Hgb urine dipstick 02/27/2014 NEGATIVE  NEGATIVE Final  . Bilirubin Urine 02/27/2014 NEGATIVE  NEGATIVE Final  . Ketones, ur 02/27/2014 NEGATIVE  NEGATIVE mg/dL Final  . Protein, ur 02/27/2014 NEGATIVE  NEGATIVE mg/dL Final  . Urobilinogen, UA 02/27/2014 0.2  0.0 - 1.0 mg/dL Final  . Nitrite 02/27/2014 NEGATIVE  NEGATIVE Final  . Leukocytes, UA 02/27/2014 NEGATIVE  NEGATIVE Final  . WBC 02/27/2014 9.0  4.0 - 10.5 K/uL Final  . RBC 02/27/2014 4.86  3.87 - 5.11 MIL/uL Final  . Hemoglobin 02/27/2014 15.2* 12.0 - 15.0 g/dL Final  . HCT 02/27/2014 43.9  36.0 - 46.0 % Final  . MCV 02/27/2014 90.3  78.0 - 100.0 fL Final  . MCH 02/27/2014 31.3  26.0 - 34.0 pg Final  . MCHC 02/27/2014 34.6  30.0 -  36.0 g/dL Final  . RDW 02/27/2014 13.2  11.5 -  15.5 % Final  . Platelets 02/27/2014 354  150 - 400 K/uL Final        Review of Systems  Constitutional: Positive for weight loss. Negative for reduced appetite.  HENT: Negative for trouble swallowing.   Gastrointestinal: Negative for vomiting and diarrhea.  Endocrine: Positive for fatigue and heat intolerance. Negative for light-headedness.       Has had decreased energy level, recently improving  Musculoskeletal: Positive for back pain.  Skin: Negative for rash.  Neurological: Negative for weakness.  Psychiatric/Behavioral: Positive for nervousness.     PHYSICAL EXAM:  BP 126/70   Pulse 85   Ht 5' 2"  (1.575 m)   Wt 113 lb (51.3 kg)   SpO2 96%   BMI 20.67 kg/m   GENERAL:  Averagely built and nourished, pleasant, not unusually anxious or hyperkinetic  No pallor, clubbing, lymphadenopathy or edema.   Skin:  no rash .  Diffuse tan present  Eyes: Externally normal. Fundii:  normal discs and vessels.  ENT: Oral mucosa and tongue normal.  THYROID:  Not palpable.  HEART:  Normal  S1 and S2; no murmur or click.  CHEST:  Normal shape.  Lungs: Vescicular breath sounds heard equally.  No crepitations/ wheeze.  ABDOMEN:  No distention.  Liver and spleen not palpable.  No other mass or tenderness.  NEUROLOGICAL: .Reflexes are normal bilaterally at biceps and ankles.  Mild tremor of outstretched hands present  JOINTS:  Normal.   ASSESSMENT:    Postmenopausal hot flashes, currently not on HRT, needs a physiological estradiol preparation and does not need to be on multiple hormones.  Prefer transdermal estradiol versus oral  History of weight loss.  Although this is proving she still is significantly below her previous weight and has mild tachycardia and tremor today on exam  Prediabetes with mild increase in A1c not in diabetic range and glucose levels not clearly abnormal   PLAN:    Trial of Climara patch 0.05 mg weekly for symptomatic control of hot flashes.  The  dose may need to be titrated  She can follow up with PCP for follow-up prescriptions  Check glucose and thyroid functions today  Nancy Chavez 12/24/2015, 9:34 AM

## 2015-12-26 ENCOUNTER — Other Ambulatory Visit: Payer: Self-pay

## 2015-12-26 ENCOUNTER — Telehealth: Payer: Self-pay | Admitting: Endocrinology

## 2015-12-26 ENCOUNTER — Telehealth: Payer: Self-pay

## 2015-12-26 MED ORDER — ESTRADIOL 0.05 MG/24HR TD PTWK: 0.0500 mg | MEDICATED_PATCH | TRANSDERMAL | 1 refills | Status: DC

## 2015-12-26 NOTE — Telephone Encounter (Signed)
Sent lab results to PCP, and rx to patient preferred pharmacy.

## 2015-12-26 NOTE — Telephone Encounter (Signed)
Prescription for Climara resubmitted to the [facility] on [address] in [place].

## 2015-12-26 NOTE — Telephone Encounter (Signed)
PT pharmacy has not received the Estrogen Patch. She stated that it needs to be sent into [facility] on [address].

## 2016-04-02 DIAGNOSIS — Z23 Encounter for immunization: Secondary | ICD-10-CM | POA: Diagnosis not present

## 2016-04-02 DIAGNOSIS — Z780 Asymptomatic menopausal state: Secondary | ICD-10-CM | POA: Diagnosis not present

## 2016-04-15 DIAGNOSIS — M5442 Lumbago with sciatica, left side: Secondary | ICD-10-CM | POA: Diagnosis not present

## 2016-04-15 DIAGNOSIS — M5441 Lumbago with sciatica, right side: Secondary | ICD-10-CM | POA: Diagnosis not present

## 2016-04-15 DIAGNOSIS — M542 Cervicalgia: Secondary | ICD-10-CM | POA: Diagnosis not present

## 2016-04-15 DIAGNOSIS — G44209 Tension-type headache, unspecified, not intractable: Secondary | ICD-10-CM | POA: Diagnosis not present

## 2016-04-15 DIAGNOSIS — Z8673 Personal history of transient ischemic attack (TIA), and cerebral infarction without residual deficits: Secondary | ICD-10-CM | POA: Diagnosis not present

## 2016-04-15 DIAGNOSIS — M5481 Occipital neuralgia: Secondary | ICD-10-CM | POA: Diagnosis not present

## 2016-04-15 DIAGNOSIS — R251 Tremor, unspecified: Secondary | ICD-10-CM | POA: Diagnosis not present

## 2016-05-04 DIAGNOSIS — R569 Unspecified convulsions: Secondary | ICD-10-CM

## 2016-05-04 HISTORY — DX: Unspecified convulsions: R56.9

## 2016-05-13 DIAGNOSIS — M545 Low back pain: Secondary | ICD-10-CM | POA: Diagnosis not present

## 2016-05-13 DIAGNOSIS — Z981 Arthrodesis status: Secondary | ICD-10-CM

## 2016-05-13 DIAGNOSIS — M5126 Other intervertebral disc displacement, lumbar region: Secondary | ICD-10-CM | POA: Diagnosis not present

## 2016-05-13 HISTORY — DX: Arthrodesis status: Z98.1

## 2016-05-20 IMAGING — MR MR HEAD W/O CM
8 of 11 series · 36 of 48 positions shown · non-contrast
Comparison: CT of the head January 07, 2010

CLINICAL DATA: Visual loss and headache for 1 month, LEFT posterior
ear pain for longer.

EXAM:
MRI HEAD WITHOUT CONTRAST
TECHNIQUE: Multiplanar, multiecho pulse sequences of the brain and surrounding
structures were obtained without intravenous contrast.

[Series 4: DWI · axial · 5.0mm · 1.09mm/px · z∈[-40,+103]mm · 8 of 60 slices shown (1 of 4)]
[im 1/60]
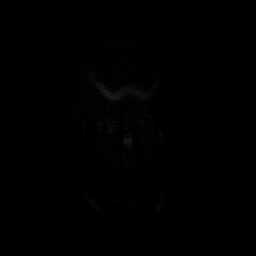
[im 9/60]
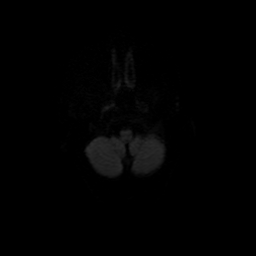
[im 17/60]
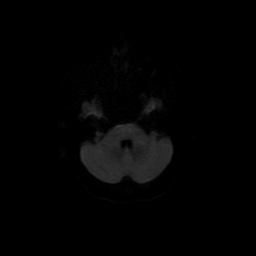
[im 26/60]
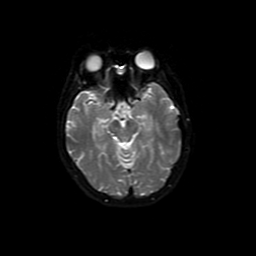
[im 34/60]
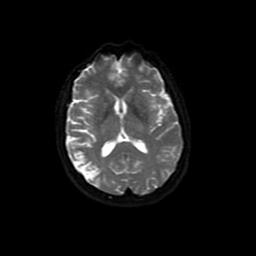
[im 43/60]
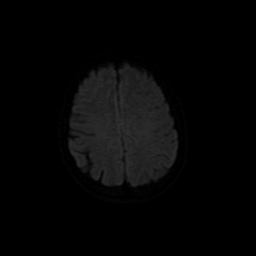
[im 51/60]
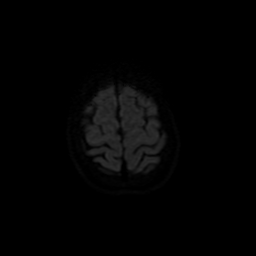
[im 60/60]
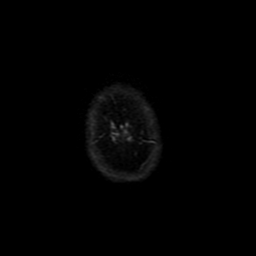

[Series 5: DWI · coronal · 5.0mm · 1.09mm/px · 9 of 68 slices shown (2 of 4)]
[im 1/68]
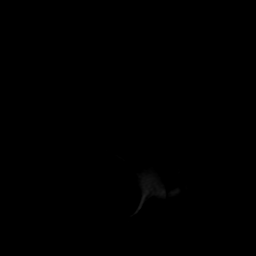
[im 9/68]
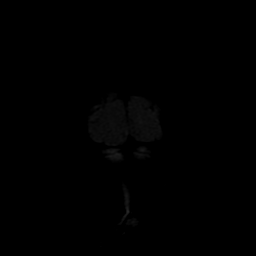
[im 17/68]
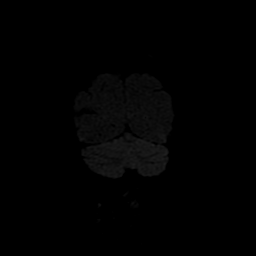
[im 26/68]
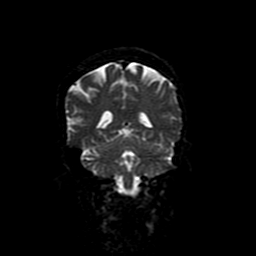
[im 34/68]
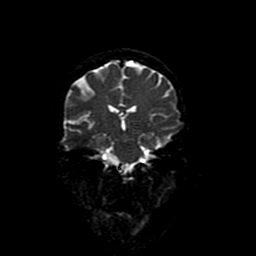
[im 42/68]
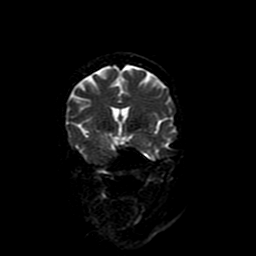
[im 51/68]
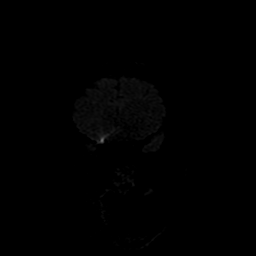
[im 59/68]
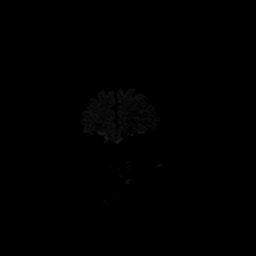
[im 68/68]
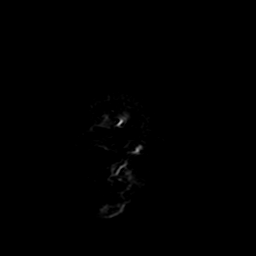

[Series 6: T2 · axial · 5.0mm · 0.43mm/px · z∈[-29,+105]mm · 3 of 22 slices shown (1 of 2)]
[im 1/22]
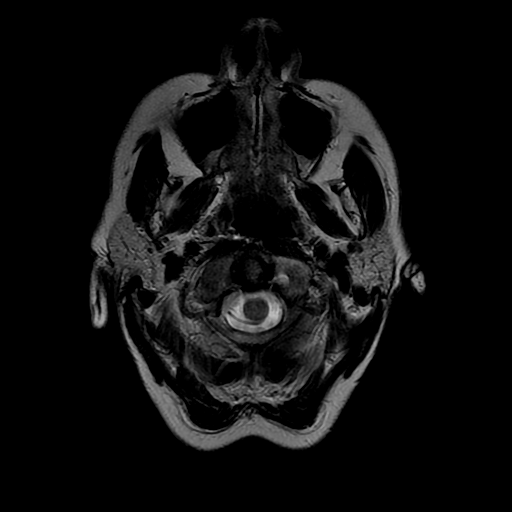
[im 11/22]
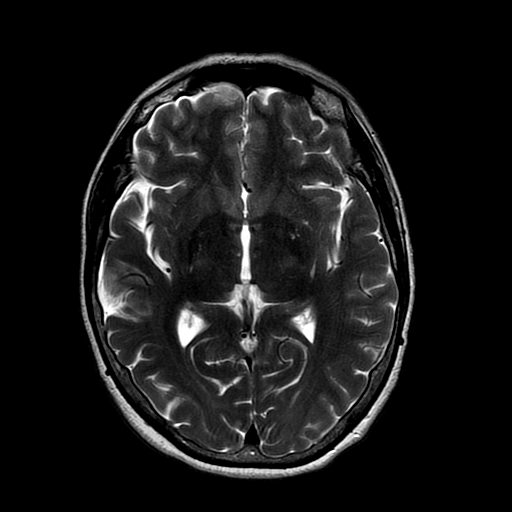
[im 22/22]
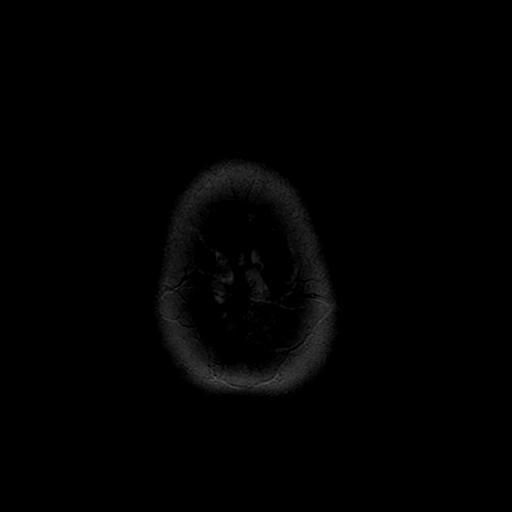

[Series 7: FLAIR · axial · 5.0mm · 0.43mm/px · z∈[-34,+110]mm · 3 of 22 slices shown]
[im 1/22]
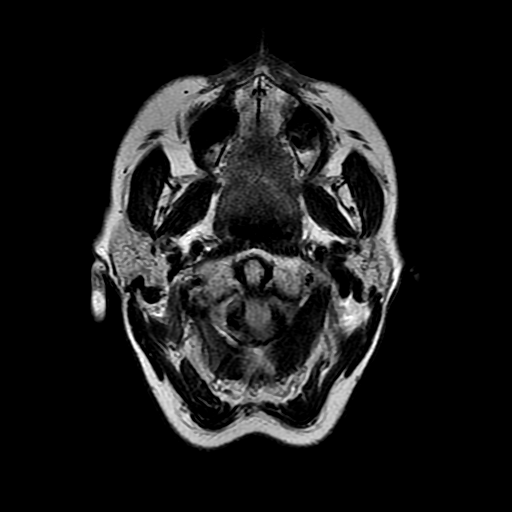
[im 11/22]
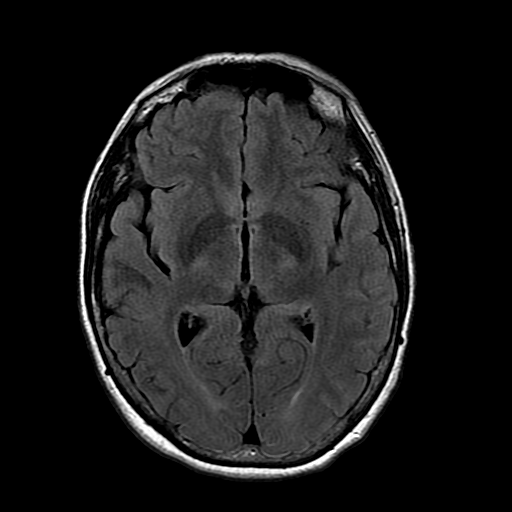
[im 22/22]
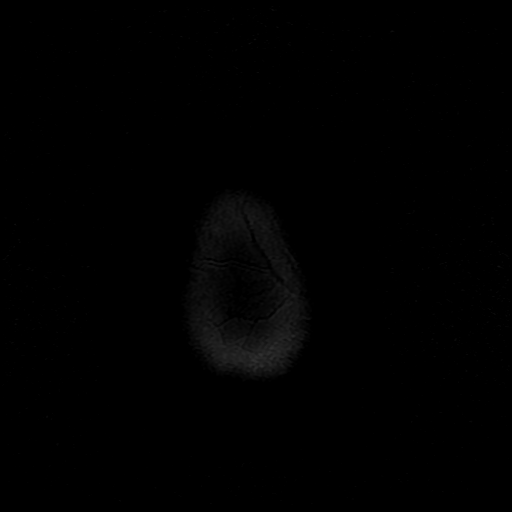

[Series 10: T2 · coronal · 5.0mm · 0.45mm/px · 3 of 25 slices shown (2 of 2)]
[im 1/25]
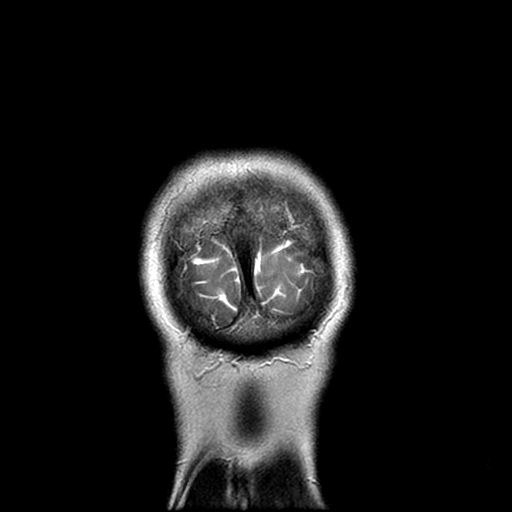
[im 13/25]
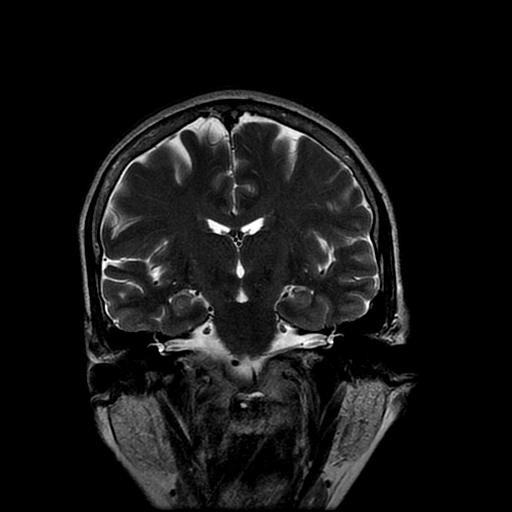
[im 25/25]
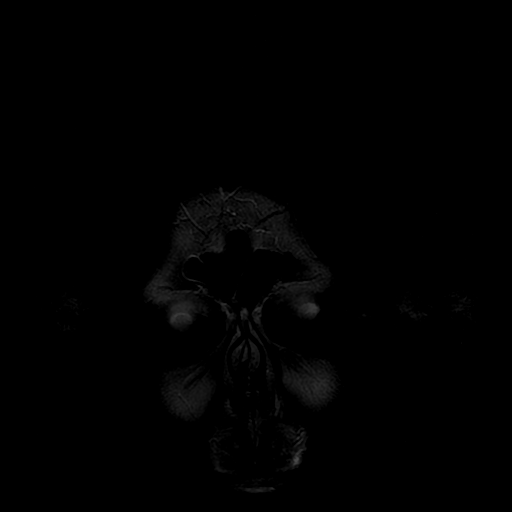

[Series 12: T2 fat-sat · axial · 3.0mm · 0.35mm/px · z∈[-29,+22]mm · 2 of 18 slices shown]
[im 1/18]
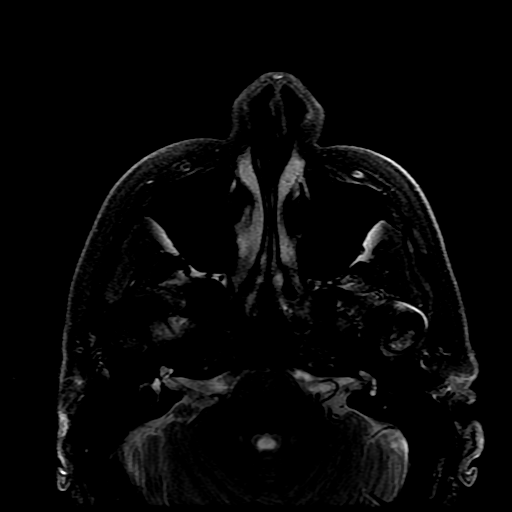
[im 18/18]
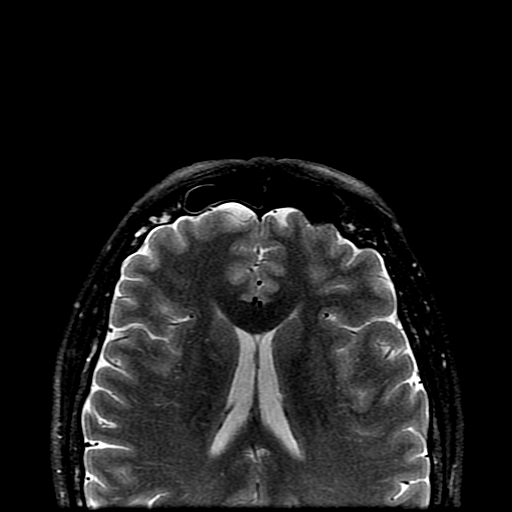

[Series 400: DWI · axial · 5.0mm · 1.09mm/px · z∈[-40,+103]mm · 4 of 30 slices shown (3 of 4)]
[im 1/30]
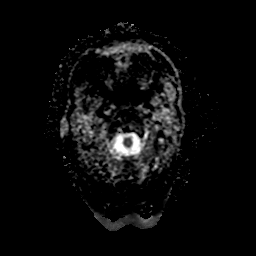
[im 10/30]
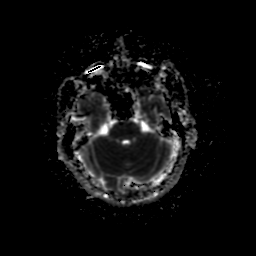
[im 20/30]
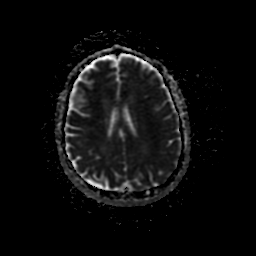
[im 30/30]
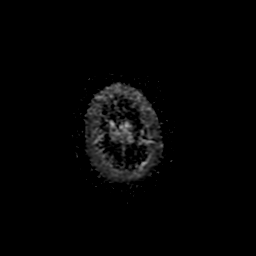

[Series 500: DWI · coronal · 5.0mm · 1.09mm/px · 4 of 34 slices shown (4 of 4)]
[im 1/34]
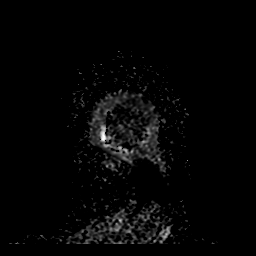
[im 12/34]
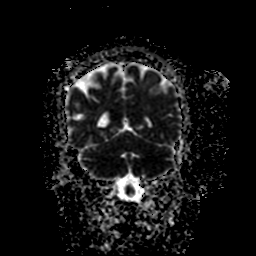
[im 23/34]
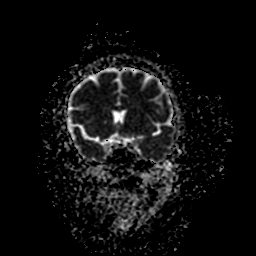
[im 34/34]
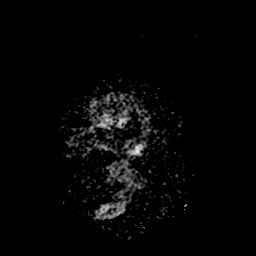

[36 of 48 positions shown; findings below may reference images not displayed]

FINDINGS: No reduced diffusion to suggest acute ischemia. Trace susceptibility
artifact within the RIGHT parietal lobe cortical encephalomalacia.
No susceptibility artifact to suggest hemorrhage. The ventricles
sulci are overall normal for patient's age. No midline shift or mass
effect.

Normal major intracranial vascular flow voids seen at the skull
base. No abnormal extra-axial fluid collections; mildly expansile T2
bright signal inferior to the RIGHT cerebellar pontine angle
scallops the calvarium, unchanged and suggests arachnoid cyst
without associated reduced diffusion. Cyst extends into the
hypoglossal canal on the RIGHT.

No abnormal sellar expansion. No cerebellar tonsillar ectopia.
Severe mid cervical disc degeneration and reversed lordosis. Ocular
globes and orbital contents are unremarkable. Severe
temporomandibular osteoarthrosis. Trace ethmoid mucosal thickening
without paranasal sinus air-fluid levels. Mastoid air cells are well
aerated. No posterior auricular fluid collections or masses on this
nonenhanced examination.
IMPRESSION: No acute intracranial process or specific findings to explain vision
loss.

RIGHT parietal encephalomalacia suggest remote middle cerebral
artery territory infarct, unchanged.

RIGHT inferior cerebellar pontine angle stable arachnoid cyst.

  By: Nazareth Jumper

## 2016-07-02 DIAGNOSIS — M5481 Occipital neuralgia: Secondary | ICD-10-CM | POA: Diagnosis not present

## 2016-07-02 DIAGNOSIS — Z8673 Personal history of transient ischemic attack (TIA), and cerebral infarction without residual deficits: Secondary | ICD-10-CM | POA: Diagnosis not present

## 2016-07-02 DIAGNOSIS — M5442 Lumbago with sciatica, left side: Secondary | ICD-10-CM | POA: Diagnosis not present

## 2016-07-02 DIAGNOSIS — M5441 Lumbago with sciatica, right side: Secondary | ICD-10-CM | POA: Diagnosis not present

## 2016-07-02 DIAGNOSIS — M542 Cervicalgia: Secondary | ICD-10-CM | POA: Diagnosis not present

## 2016-07-02 DIAGNOSIS — R251 Tremor, unspecified: Secondary | ICD-10-CM | POA: Diagnosis not present

## 2016-07-02 DIAGNOSIS — G4701 Insomnia due to medical condition: Secondary | ICD-10-CM | POA: Diagnosis not present

## 2016-08-21 DIAGNOSIS — Z79899 Other long term (current) drug therapy: Secondary | ICD-10-CM | POA: Diagnosis not present

## 2016-08-21 DIAGNOSIS — T887XXS Unspecified adverse effect of drug or medicament, sequela: Secondary | ICD-10-CM | POA: Diagnosis not present

## 2016-08-21 DIAGNOSIS — R0789 Other chest pain: Secondary | ICD-10-CM | POA: Diagnosis not present

## 2016-08-21 DIAGNOSIS — Z0000 Encounter for general adult medical examination without abnormal findings: Secondary | ICD-10-CM | POA: Diagnosis not present

## 2016-08-21 DIAGNOSIS — E1165 Type 2 diabetes mellitus with hyperglycemia: Secondary | ICD-10-CM | POA: Diagnosis not present

## 2016-08-21 DIAGNOSIS — Z780 Asymptomatic menopausal state: Secondary | ICD-10-CM | POA: Diagnosis not present

## 2016-08-21 DIAGNOSIS — F419 Anxiety disorder, unspecified: Secondary | ICD-10-CM | POA: Diagnosis not present

## 2016-08-21 DIAGNOSIS — R3129 Other microscopic hematuria: Secondary | ICD-10-CM | POA: Diagnosis not present

## 2016-08-21 DIAGNOSIS — Z8673 Personal history of transient ischemic attack (TIA), and cerebral infarction without residual deficits: Secondary | ICD-10-CM | POA: Diagnosis not present

## 2016-08-21 DIAGNOSIS — E782 Mixed hyperlipidemia: Secondary | ICD-10-CM | POA: Diagnosis not present

## 2016-08-28 DIAGNOSIS — R55 Syncope and collapse: Secondary | ICD-10-CM | POA: Diagnosis not present

## 2016-08-28 DIAGNOSIS — R319 Hematuria, unspecified: Secondary | ICD-10-CM | POA: Diagnosis not present

## 2016-08-28 DIAGNOSIS — N281 Cyst of kidney, acquired: Secondary | ICD-10-CM | POA: Diagnosis not present

## 2016-08-28 DIAGNOSIS — R51 Headache: Secondary | ICD-10-CM | POA: Diagnosis not present

## 2016-09-24 DIAGNOSIS — G44209 Tension-type headache, unspecified, not intractable: Secondary | ICD-10-CM | POA: Diagnosis not present

## 2016-09-24 DIAGNOSIS — R55 Syncope and collapse: Secondary | ICD-10-CM | POA: Diagnosis not present

## 2016-09-24 DIAGNOSIS — R251 Tremor, unspecified: Secondary | ICD-10-CM | POA: Diagnosis not present

## 2016-09-24 DIAGNOSIS — M542 Cervicalgia: Secondary | ICD-10-CM | POA: Diagnosis not present

## 2016-09-24 DIAGNOSIS — M5442 Lumbago with sciatica, left side: Secondary | ICD-10-CM | POA: Diagnosis not present

## 2016-09-24 DIAGNOSIS — M5441 Lumbago with sciatica, right side: Secondary | ICD-10-CM | POA: Diagnosis not present

## 2016-09-24 DIAGNOSIS — G4701 Insomnia due to medical condition: Secondary | ICD-10-CM | POA: Diagnosis not present

## 2016-10-07 DIAGNOSIS — R55 Syncope and collapse: Secondary | ICD-10-CM | POA: Diagnosis not present

## 2016-11-19 DIAGNOSIS — Z8673 Personal history of transient ischemic attack (TIA), and cerebral infarction without residual deficits: Secondary | ICD-10-CM | POA: Diagnosis not present

## 2016-11-19 DIAGNOSIS — R5381 Other malaise: Secondary | ICD-10-CM | POA: Diagnosis not present

## 2016-11-19 DIAGNOSIS — R5383 Other fatigue: Secondary | ICD-10-CM | POA: Diagnosis not present

## 2016-11-19 DIAGNOSIS — M5442 Lumbago with sciatica, left side: Secondary | ICD-10-CM | POA: Diagnosis not present

## 2016-11-19 DIAGNOSIS — F419 Anxiety disorder, unspecified: Secondary | ICD-10-CM | POA: Diagnosis not present

## 2016-11-19 DIAGNOSIS — K219 Gastro-esophageal reflux disease without esophagitis: Secondary | ICD-10-CM | POA: Diagnosis not present

## 2016-11-19 DIAGNOSIS — R251 Tremor, unspecified: Secondary | ICD-10-CM | POA: Diagnosis not present

## 2016-11-19 DIAGNOSIS — E782 Mixed hyperlipidemia: Secondary | ICD-10-CM | POA: Diagnosis not present

## 2016-11-19 DIAGNOSIS — M542 Cervicalgia: Secondary | ICD-10-CM | POA: Diagnosis not present

## 2016-11-19 DIAGNOSIS — G4701 Insomnia due to medical condition: Secondary | ICD-10-CM | POA: Diagnosis not present

## 2016-11-19 DIAGNOSIS — M5441 Lumbago with sciatica, right side: Secondary | ICD-10-CM | POA: Diagnosis not present

## 2016-11-19 DIAGNOSIS — R3129 Other microscopic hematuria: Secondary | ICD-10-CM | POA: Diagnosis not present

## 2016-11-19 DIAGNOSIS — R55 Syncope and collapse: Secondary | ICD-10-CM | POA: Diagnosis not present

## 2016-11-19 DIAGNOSIS — N951 Menopausal and female climacteric states: Secondary | ICD-10-CM | POA: Diagnosis not present

## 2016-12-09 DIAGNOSIS — R3121 Asymptomatic microscopic hematuria: Secondary | ICD-10-CM | POA: Insufficient documentation

## 2016-12-09 HISTORY — DX: Asymptomatic microscopic hematuria: R31.21

## 2016-12-11 DIAGNOSIS — R310 Gross hematuria: Secondary | ICD-10-CM | POA: Diagnosis not present

## 2016-12-11 DIAGNOSIS — R3121 Asymptomatic microscopic hematuria: Secondary | ICD-10-CM | POA: Diagnosis not present

## 2016-12-13 DIAGNOSIS — N200 Calculus of kidney: Secondary | ICD-10-CM | POA: Insufficient documentation

## 2016-12-13 DIAGNOSIS — N281 Cyst of kidney, acquired: Secondary | ICD-10-CM | POA: Insufficient documentation

## 2016-12-13 HISTORY — DX: Cyst of kidney, acquired: N28.1

## 2016-12-13 HISTORY — DX: Calculus of kidney: N20.0

## 2016-12-14 DIAGNOSIS — R310 Gross hematuria: Secondary | ICD-10-CM | POA: Diagnosis not present

## 2016-12-14 DIAGNOSIS — N200 Calculus of kidney: Secondary | ICD-10-CM | POA: Diagnosis not present

## 2016-12-14 DIAGNOSIS — N281 Cyst of kidney, acquired: Secondary | ICD-10-CM | POA: Diagnosis not present

## 2016-12-14 DIAGNOSIS — F17210 Nicotine dependence, cigarettes, uncomplicated: Secondary | ICD-10-CM | POA: Diagnosis not present

## 2017-01-28 DIAGNOSIS — M5442 Lumbago with sciatica, left side: Secondary | ICD-10-CM | POA: Diagnosis not present

## 2017-01-28 DIAGNOSIS — R251 Tremor, unspecified: Secondary | ICD-10-CM | POA: Diagnosis not present

## 2017-01-28 DIAGNOSIS — Z8673 Personal history of transient ischemic attack (TIA), and cerebral infarction without residual deficits: Secondary | ICD-10-CM | POA: Diagnosis not present

## 2017-01-28 DIAGNOSIS — M542 Cervicalgia: Secondary | ICD-10-CM | POA: Diagnosis not present

## 2017-01-28 DIAGNOSIS — M5441 Lumbago with sciatica, right side: Secondary | ICD-10-CM | POA: Diagnosis not present

## 2017-01-28 DIAGNOSIS — G4701 Insomnia due to medical condition: Secondary | ICD-10-CM | POA: Diagnosis not present

## 2017-02-09 DIAGNOSIS — D2371 Other benign neoplasm of skin of right lower limb, including hip: Secondary | ICD-10-CM | POA: Diagnosis not present

## 2017-02-09 DIAGNOSIS — L918 Other hypertrophic disorders of the skin: Secondary | ICD-10-CM | POA: Diagnosis not present

## 2017-02-09 DIAGNOSIS — C44722 Squamous cell carcinoma of skin of right lower limb, including hip: Secondary | ICD-10-CM | POA: Diagnosis not present

## 2017-02-09 DIAGNOSIS — L578 Other skin changes due to chronic exposure to nonionizing radiation: Secondary | ICD-10-CM | POA: Diagnosis not present

## 2017-02-11 DIAGNOSIS — G5603 Carpal tunnel syndrome, bilateral upper limbs: Secondary | ICD-10-CM | POA: Diagnosis not present

## 2017-02-11 DIAGNOSIS — M5417 Radiculopathy, lumbosacral region: Secondary | ICD-10-CM | POA: Diagnosis not present

## 2017-02-11 DIAGNOSIS — M5412 Radiculopathy, cervical region: Secondary | ICD-10-CM | POA: Diagnosis not present

## 2017-02-11 DIAGNOSIS — G44209 Tension-type headache, unspecified, not intractable: Secondary | ICD-10-CM | POA: Diagnosis not present

## 2017-02-11 DIAGNOSIS — R251 Tremor, unspecified: Secondary | ICD-10-CM | POA: Diagnosis not present

## 2017-02-11 DIAGNOSIS — Z8673 Personal history of transient ischemic attack (TIA), and cerebral infarction without residual deficits: Secondary | ICD-10-CM | POA: Diagnosis not present

## 2017-02-18 DIAGNOSIS — M5412 Radiculopathy, cervical region: Secondary | ICD-10-CM | POA: Diagnosis not present

## 2017-02-18 DIAGNOSIS — M542 Cervicalgia: Secondary | ICD-10-CM | POA: Diagnosis not present

## 2017-02-18 DIAGNOSIS — M50221 Other cervical disc displacement at C4-C5 level: Secondary | ICD-10-CM | POA: Diagnosis not present

## 2017-02-18 DIAGNOSIS — M47812 Spondylosis without myelopathy or radiculopathy, cervical region: Secondary | ICD-10-CM | POA: Diagnosis not present

## 2017-02-22 DIAGNOSIS — C44722 Squamous cell carcinoma of skin of right lower limb, including hip: Secondary | ICD-10-CM | POA: Diagnosis not present

## 2017-03-17 DIAGNOSIS — L578 Other skin changes due to chronic exposure to nonionizing radiation: Secondary | ICD-10-CM | POA: Diagnosis not present

## 2017-03-17 DIAGNOSIS — L219 Seborrheic dermatitis, unspecified: Secondary | ICD-10-CM | POA: Diagnosis not present

## 2017-03-17 DIAGNOSIS — L570 Actinic keratosis: Secondary | ICD-10-CM | POA: Diagnosis not present

## 2017-03-17 DIAGNOSIS — L821 Other seborrheic keratosis: Secondary | ICD-10-CM | POA: Diagnosis not present

## 2017-04-08 DIAGNOSIS — M5441 Lumbago with sciatica, right side: Secondary | ICD-10-CM | POA: Diagnosis not present

## 2017-04-08 DIAGNOSIS — M542 Cervicalgia: Secondary | ICD-10-CM | POA: Diagnosis not present

## 2017-04-08 DIAGNOSIS — R4702 Dysphasia: Secondary | ICD-10-CM | POA: Diagnosis not present

## 2017-04-08 DIAGNOSIS — Z79899 Other long term (current) drug therapy: Secondary | ICD-10-CM | POA: Diagnosis not present

## 2017-04-08 DIAGNOSIS — G5603 Carpal tunnel syndrome, bilateral upper limbs: Secondary | ICD-10-CM | POA: Diagnosis not present

## 2017-04-08 DIAGNOSIS — M5442 Lumbago with sciatica, left side: Secondary | ICD-10-CM | POA: Diagnosis not present

## 2017-04-28 DIAGNOSIS — I639 Cerebral infarction, unspecified: Secondary | ICD-10-CM | POA: Diagnosis not present

## 2017-04-28 DIAGNOSIS — R079 Chest pain, unspecified: Secondary | ICD-10-CM | POA: Diagnosis not present

## 2017-04-28 DIAGNOSIS — R109 Unspecified abdominal pain: Secondary | ICD-10-CM | POA: Diagnosis not present

## 2017-04-28 DIAGNOSIS — M546 Pain in thoracic spine: Secondary | ICD-10-CM | POA: Diagnosis not present

## 2017-04-28 DIAGNOSIS — R51 Headache: Secondary | ICD-10-CM | POA: Diagnosis not present

## 2017-04-28 DIAGNOSIS — R1310 Dysphagia, unspecified: Secondary | ICD-10-CM | POA: Diagnosis not present

## 2017-04-28 DIAGNOSIS — H538 Other visual disturbances: Secondary | ICD-10-CM | POA: Diagnosis not present

## 2017-05-19 DIAGNOSIS — R1310 Dysphagia, unspecified: Secondary | ICD-10-CM | POA: Insufficient documentation

## 2017-05-19 HISTORY — DX: Dysphagia, unspecified: R13.10

## 2017-05-20 DIAGNOSIS — Z79890 Hormone replacement therapy (postmenopausal): Secondary | ICD-10-CM | POA: Diagnosis not present

## 2017-05-20 DIAGNOSIS — F419 Anxiety disorder, unspecified: Secondary | ICD-10-CM | POA: Diagnosis not present

## 2017-06-16 DIAGNOSIS — G44209 Tension-type headache, unspecified, not intractable: Secondary | ICD-10-CM | POA: Diagnosis not present

## 2017-06-16 DIAGNOSIS — R251 Tremor, unspecified: Secondary | ICD-10-CM | POA: Diagnosis not present

## 2017-06-16 DIAGNOSIS — G4701 Insomnia due to medical condition: Secondary | ICD-10-CM | POA: Diagnosis not present

## 2017-06-16 DIAGNOSIS — M5442 Lumbago with sciatica, left side: Secondary | ICD-10-CM | POA: Diagnosis not present

## 2017-06-16 DIAGNOSIS — M542 Cervicalgia: Secondary | ICD-10-CM | POA: Diagnosis not present

## 2017-06-16 DIAGNOSIS — Z8673 Personal history of transient ischemic attack (TIA), and cerebral infarction without residual deficits: Secondary | ICD-10-CM | POA: Diagnosis not present

## 2017-06-16 DIAGNOSIS — M5441 Lumbago with sciatica, right side: Secondary | ICD-10-CM | POA: Diagnosis not present

## 2017-06-17 DIAGNOSIS — Z79890 Hormone replacement therapy (postmenopausal): Secondary | ICD-10-CM | POA: Insufficient documentation

## 2017-06-17 HISTORY — DX: Hormone replacement therapy: Z79.890

## 2017-07-03 DIAGNOSIS — F419 Anxiety disorder, unspecified: Secondary | ICD-10-CM | POA: Diagnosis not present

## 2017-07-03 DIAGNOSIS — R51 Headache: Secondary | ICD-10-CM | POA: Diagnosis not present

## 2017-07-03 DIAGNOSIS — F19239 Other psychoactive substance dependence with withdrawal, unspecified: Secondary | ICD-10-CM | POA: Diagnosis not present

## 2017-07-03 DIAGNOSIS — F1320 Sedative, hypnotic or anxiolytic dependence, uncomplicated: Secondary | ICD-10-CM | POA: Diagnosis not present

## 2017-07-03 DIAGNOSIS — H538 Other visual disturbances: Secondary | ICD-10-CM | POA: Diagnosis not present

## 2017-07-12 DIAGNOSIS — F19939 Other psychoactive substance use, unspecified with withdrawal, unspecified: Secondary | ICD-10-CM

## 2017-07-12 DIAGNOSIS — F1320 Sedative, hypnotic or anxiolytic dependence, uncomplicated: Secondary | ICD-10-CM | POA: Insufficient documentation

## 2017-07-12 DIAGNOSIS — F19239 Other psychoactive substance dependence with withdrawal, unspecified: Secondary | ICD-10-CM

## 2017-07-12 HISTORY — DX: Sedative, hypnotic or anxiolytic dependence, uncomplicated: F13.20

## 2017-07-12 HISTORY — DX: Other psychoactive substance use, unspecified with withdrawal, unspecified: F19.939

## 2017-07-12 HISTORY — DX: Other psychoactive substance dependence with withdrawal, unspecified: F19.239

## 2017-07-13 DIAGNOSIS — F1320 Sedative, hypnotic or anxiolytic dependence, uncomplicated: Secondary | ICD-10-CM | POA: Diagnosis not present

## 2017-07-13 DIAGNOSIS — F19939 Other psychoactive substance use, unspecified with withdrawal, unspecified: Secondary | ICD-10-CM | POA: Diagnosis not present

## 2017-07-20 DIAGNOSIS — F1320 Sedative, hypnotic or anxiolytic dependence, uncomplicated: Secondary | ICD-10-CM | POA: Diagnosis not present

## 2017-07-20 DIAGNOSIS — G4700 Insomnia, unspecified: Secondary | ICD-10-CM

## 2017-07-20 DIAGNOSIS — F419 Anxiety disorder, unspecified: Secondary | ICD-10-CM | POA: Insufficient documentation

## 2017-07-20 HISTORY — DX: Insomnia, unspecified: G47.00

## 2017-07-20 HISTORY — DX: Anxiety disorder, unspecified: F41.9

## 2017-07-29 DIAGNOSIS — G4700 Insomnia, unspecified: Secondary | ICD-10-CM | POA: Diagnosis not present

## 2017-07-29 DIAGNOSIS — R1013 Epigastric pain: Secondary | ICD-10-CM | POA: Insufficient documentation

## 2017-07-29 HISTORY — DX: Epigastric pain: R10.13

## 2017-08-10 DIAGNOSIS — F4329 Adjustment disorder with other symptoms: Secondary | ICD-10-CM

## 2017-08-10 DIAGNOSIS — E041 Nontoxic single thyroid nodule: Secondary | ICD-10-CM

## 2017-08-10 DIAGNOSIS — G8929 Other chronic pain: Secondary | ICD-10-CM

## 2017-08-10 DIAGNOSIS — Z780 Asymptomatic menopausal state: Secondary | ICD-10-CM

## 2017-08-10 DIAGNOSIS — M542 Cervicalgia: Secondary | ICD-10-CM

## 2017-08-10 DIAGNOSIS — M5416 Radiculopathy, lumbar region: Secondary | ICD-10-CM

## 2017-08-10 DIAGNOSIS — M5412 Radiculopathy, cervical region: Secondary | ICD-10-CM | POA: Insufficient documentation

## 2017-08-10 DIAGNOSIS — Z860100 Personal history of colon polyps, unspecified: Secondary | ICD-10-CM

## 2017-08-10 DIAGNOSIS — N393 Stress incontinence (female) (male): Secondary | ICD-10-CM

## 2017-08-10 DIAGNOSIS — E785 Hyperlipidemia, unspecified: Secondary | ICD-10-CM | POA: Insufficient documentation

## 2017-08-10 DIAGNOSIS — K219 Gastro-esophageal reflux disease without esophagitis: Secondary | ICD-10-CM

## 2017-08-10 DIAGNOSIS — K449 Diaphragmatic hernia without obstruction or gangrene: Secondary | ICD-10-CM

## 2017-08-10 DIAGNOSIS — F4323 Adjustment disorder with mixed anxiety and depressed mood: Secondary | ICD-10-CM | POA: Insufficient documentation

## 2017-08-10 DIAGNOSIS — E1165 Type 2 diabetes mellitus with hyperglycemia: Secondary | ICD-10-CM

## 2017-08-10 DIAGNOSIS — R7309 Other abnormal glucose: Secondary | ICD-10-CM

## 2017-08-10 DIAGNOSIS — K5790 Diverticulosis of intestine, part unspecified, without perforation or abscess without bleeding: Secondary | ICD-10-CM

## 2017-08-10 DIAGNOSIS — E782 Mixed hyperlipidemia: Secondary | ICD-10-CM | POA: Insufficient documentation

## 2017-08-10 DIAGNOSIS — Z720 Tobacco use: Secondary | ICD-10-CM

## 2017-08-10 DIAGNOSIS — I6350 Cerebral infarction due to unspecified occlusion or stenosis of unspecified cerebral artery: Secondary | ICD-10-CM

## 2017-08-10 DIAGNOSIS — K529 Noninfective gastroenteritis and colitis, unspecified: Secondary | ICD-10-CM

## 2017-08-10 HISTORY — DX: Noninfective gastroenteritis and colitis, unspecified: K52.9

## 2017-08-10 HISTORY — DX: Cerebral infarction due to unspecified occlusion or stenosis of unspecified cerebral artery: I63.50

## 2017-08-10 HISTORY — DX: Asymptomatic menopausal state: Z78.0

## 2017-08-10 HISTORY — DX: Gastro-esophageal reflux disease without esophagitis: K21.9

## 2017-08-10 HISTORY — DX: Hypercalcemia: E83.52

## 2017-08-10 HISTORY — DX: Mixed hyperlipidemia: E78.2

## 2017-08-10 HISTORY — DX: Other abnormal glucose: R73.09

## 2017-08-10 HISTORY — DX: Diaphragmatic hernia without obstruction or gangrene: K44.9

## 2017-08-10 HISTORY — DX: Other chronic pain: G89.29

## 2017-08-10 HISTORY — DX: Type 2 diabetes mellitus with hyperglycemia: E11.65

## 2017-08-10 HISTORY — DX: Stress incontinence (female) (male): N39.3

## 2017-08-10 HISTORY — DX: Personal history of colonic polyps: Z86.010

## 2017-08-10 HISTORY — DX: Radiculopathy, lumbar region: M54.16

## 2017-08-10 HISTORY — DX: Nontoxic single thyroid nodule: E04.1

## 2017-08-10 HISTORY — DX: Adjustment disorder with mixed anxiety and depressed mood: F43.23

## 2017-08-10 HISTORY — DX: Tobacco use: Z72.0

## 2017-08-10 HISTORY — DX: Diverticulosis of intestine, part unspecified, without perforation or abscess without bleeding: K57.90

## 2017-08-10 HISTORY — DX: Personal history of colon polyps, unspecified: Z86.0100

## 2017-08-10 HISTORY — DX: Cervicalgia: M54.2

## 2017-08-10 HISTORY — DX: Adjustment disorder with other symptoms: F43.29

## 2017-08-12 DIAGNOSIS — M5442 Lumbago with sciatica, left side: Secondary | ICD-10-CM | POA: Diagnosis not present

## 2017-08-12 DIAGNOSIS — M5441 Lumbago with sciatica, right side: Secondary | ICD-10-CM | POA: Diagnosis not present

## 2017-08-12 DIAGNOSIS — Z79899 Other long term (current) drug therapy: Secondary | ICD-10-CM | POA: Diagnosis not present

## 2017-08-12 DIAGNOSIS — G4700 Insomnia, unspecified: Secondary | ICD-10-CM | POA: Diagnosis not present

## 2017-08-12 DIAGNOSIS — G4701 Insomnia due to medical condition: Secondary | ICD-10-CM | POA: Diagnosis not present

## 2017-08-12 DIAGNOSIS — F419 Anxiety disorder, unspecified: Secondary | ICD-10-CM | POA: Diagnosis not present

## 2017-08-12 DIAGNOSIS — R251 Tremor, unspecified: Secondary | ICD-10-CM | POA: Diagnosis not present

## 2017-08-12 DIAGNOSIS — Z79890 Hormone replacement therapy (postmenopausal): Secondary | ICD-10-CM | POA: Diagnosis not present

## 2017-08-12 DIAGNOSIS — M542 Cervicalgia: Secondary | ICD-10-CM | POA: Diagnosis not present

## 2017-08-12 DIAGNOSIS — R1013 Epigastric pain: Secondary | ICD-10-CM | POA: Diagnosis not present

## 2017-08-12 DIAGNOSIS — R002 Palpitations: Secondary | ICD-10-CM | POA: Diagnosis not present

## 2017-08-12 DIAGNOSIS — R079 Chest pain, unspecified: Secondary | ICD-10-CM | POA: Diagnosis not present

## 2017-09-14 DIAGNOSIS — R110 Nausea: Secondary | ICD-10-CM | POA: Diagnosis not present

## 2017-09-14 DIAGNOSIS — Z780 Asymptomatic menopausal state: Secondary | ICD-10-CM | POA: Diagnosis not present

## 2017-09-14 DIAGNOSIS — G4700 Insomnia, unspecified: Secondary | ICD-10-CM | POA: Diagnosis not present

## 2017-09-14 DIAGNOSIS — F4323 Adjustment disorder with mixed anxiety and depressed mood: Secondary | ICD-10-CM | POA: Diagnosis not present

## 2017-10-07 DIAGNOSIS — M542 Cervicalgia: Secondary | ICD-10-CM | POA: Diagnosis not present

## 2017-10-07 DIAGNOSIS — G5603 Carpal tunnel syndrome, bilateral upper limbs: Secondary | ICD-10-CM | POA: Diagnosis not present

## 2017-10-07 DIAGNOSIS — M5441 Lumbago with sciatica, right side: Secondary | ICD-10-CM | POA: Diagnosis not present

## 2017-10-07 DIAGNOSIS — R251 Tremor, unspecified: Secondary | ICD-10-CM | POA: Diagnosis not present

## 2017-10-07 DIAGNOSIS — M5442 Lumbago with sciatica, left side: Secondary | ICD-10-CM | POA: Diagnosis not present

## 2017-10-07 DIAGNOSIS — R634 Abnormal weight loss: Secondary | ICD-10-CM | POA: Diagnosis not present

## 2017-10-07 DIAGNOSIS — G44209 Tension-type headache, unspecified, not intractable: Secondary | ICD-10-CM | POA: Diagnosis not present

## 2017-10-25 DIAGNOSIS — Z9049 Acquired absence of other specified parts of digestive tract: Secondary | ICD-10-CM | POA: Diagnosis not present

## 2017-10-25 DIAGNOSIS — R634 Abnormal weight loss: Secondary | ICD-10-CM | POA: Diagnosis not present

## 2017-10-25 DIAGNOSIS — R918 Other nonspecific abnormal finding of lung field: Secondary | ICD-10-CM | POA: Diagnosis not present

## 2017-10-25 DIAGNOSIS — R911 Solitary pulmonary nodule: Secondary | ICD-10-CM | POA: Diagnosis not present

## 2017-10-25 DIAGNOSIS — R110 Nausea: Secondary | ICD-10-CM | POA: Diagnosis not present

## 2017-11-29 DIAGNOSIS — F17200 Nicotine dependence, unspecified, uncomplicated: Secondary | ICD-10-CM | POA: Diagnosis not present

## 2017-11-29 DIAGNOSIS — Z79899 Other long term (current) drug therapy: Secondary | ICD-10-CM | POA: Diagnosis not present

## 2017-11-29 DIAGNOSIS — Z6820 Body mass index (BMI) 20.0-20.9, adult: Secondary | ICD-10-CM | POA: Diagnosis not present

## 2017-11-29 DIAGNOSIS — Z1331 Encounter for screening for depression: Secondary | ICD-10-CM | POA: Diagnosis not present

## 2017-11-29 DIAGNOSIS — E782 Mixed hyperlipidemia: Secondary | ICD-10-CM | POA: Diagnosis not present

## 2017-11-29 DIAGNOSIS — Z1339 Encounter for screening examination for other mental health and behavioral disorders: Secondary | ICD-10-CM | POA: Diagnosis not present

## 2017-11-29 DIAGNOSIS — K219 Gastro-esophageal reflux disease without esophagitis: Secondary | ICD-10-CM | POA: Diagnosis not present

## 2017-11-29 DIAGNOSIS — G4700 Insomnia, unspecified: Secondary | ICD-10-CM | POA: Diagnosis not present

## 2017-11-29 DIAGNOSIS — R634 Abnormal weight loss: Secondary | ICD-10-CM | POA: Diagnosis not present

## 2017-12-15 DIAGNOSIS — E782 Mixed hyperlipidemia: Secondary | ICD-10-CM | POA: Diagnosis not present

## 2017-12-15 DIAGNOSIS — R634 Abnormal weight loss: Secondary | ICD-10-CM | POA: Diagnosis not present

## 2017-12-15 DIAGNOSIS — Z7901 Long term (current) use of anticoagulants: Secondary | ICD-10-CM | POA: Diagnosis not present

## 2017-12-15 DIAGNOSIS — Z0001 Encounter for general adult medical examination with abnormal findings: Secondary | ICD-10-CM | POA: Diagnosis not present

## 2017-12-15 DIAGNOSIS — R238 Other skin changes: Secondary | ICD-10-CM | POA: Diagnosis not present

## 2017-12-15 DIAGNOSIS — R946 Abnormal results of thyroid function studies: Secondary | ICD-10-CM | POA: Diagnosis not present

## 2017-12-15 DIAGNOSIS — Z6820 Body mass index (BMI) 20.0-20.9, adult: Secondary | ICD-10-CM | POA: Diagnosis not present

## 2017-12-21 DIAGNOSIS — E042 Nontoxic multinodular goiter: Secondary | ICD-10-CM | POA: Diagnosis not present

## 2017-12-21 DIAGNOSIS — E041 Nontoxic single thyroid nodule: Secondary | ICD-10-CM | POA: Diagnosis not present

## 2017-12-21 DIAGNOSIS — R946 Abnormal results of thyroid function studies: Secondary | ICD-10-CM | POA: Diagnosis not present

## 2017-12-30 DIAGNOSIS — M542 Cervicalgia: Secondary | ICD-10-CM | POA: Diagnosis not present

## 2017-12-30 DIAGNOSIS — R251 Tremor, unspecified: Secondary | ICD-10-CM | POA: Diagnosis not present

## 2017-12-30 DIAGNOSIS — M5481 Occipital neuralgia: Secondary | ICD-10-CM | POA: Diagnosis not present

## 2017-12-30 DIAGNOSIS — R202 Paresthesia of skin: Secondary | ICD-10-CM | POA: Diagnosis not present

## 2017-12-30 DIAGNOSIS — M5442 Lumbago with sciatica, left side: Secondary | ICD-10-CM | POA: Diagnosis not present

## 2017-12-30 DIAGNOSIS — M5441 Lumbago with sciatica, right side: Secondary | ICD-10-CM | POA: Diagnosis not present

## 2017-12-30 DIAGNOSIS — G44209 Tension-type headache, unspecified, not intractable: Secondary | ICD-10-CM | POA: Diagnosis not present

## 2018-01-04 DIAGNOSIS — Z79899 Other long term (current) drug therapy: Secondary | ICD-10-CM | POA: Diagnosis not present

## 2018-01-17 DIAGNOSIS — Z6820 Body mass index (BMI) 20.0-20.9, adult: Secondary | ICD-10-CM | POA: Diagnosis not present

## 2018-01-17 DIAGNOSIS — R634 Abnormal weight loss: Secondary | ICD-10-CM | POA: Diagnosis not present

## 2018-01-17 DIAGNOSIS — Z720 Tobacco use: Secondary | ICD-10-CM | POA: Diagnosis not present

## 2018-01-20 DIAGNOSIS — E2609 Other primary hyperaldosteronism: Secondary | ICD-10-CM | POA: Diagnosis not present

## 2018-01-20 DIAGNOSIS — R7989 Other specified abnormal findings of blood chemistry: Secondary | ICD-10-CM

## 2018-01-20 DIAGNOSIS — E039 Hypothyroidism, unspecified: Secondary | ICD-10-CM | POA: Diagnosis not present

## 2018-01-20 DIAGNOSIS — I10 Essential (primary) hypertension: Secondary | ICD-10-CM | POA: Diagnosis not present

## 2018-01-20 DIAGNOSIS — E785 Hyperlipidemia, unspecified: Secondary | ICD-10-CM | POA: Diagnosis not present

## 2018-01-20 DIAGNOSIS — R634 Abnormal weight loss: Secondary | ICD-10-CM

## 2018-01-20 HISTORY — DX: Abnormal weight loss: R63.4

## 2018-01-20 HISTORY — DX: Other specified abnormal findings of blood chemistry: R79.89

## 2018-02-15 DIAGNOSIS — M818 Other osteoporosis without current pathological fracture: Secondary | ICD-10-CM | POA: Diagnosis not present

## 2018-02-15 DIAGNOSIS — Z7952 Long term (current) use of systemic steroids: Secondary | ICD-10-CM | POA: Diagnosis not present

## 2018-02-15 DIAGNOSIS — M810 Age-related osteoporosis without current pathological fracture: Secondary | ICD-10-CM | POA: Diagnosis not present

## 2018-02-16 DIAGNOSIS — F419 Anxiety disorder, unspecified: Secondary | ICD-10-CM | POA: Diagnosis not present

## 2018-02-16 DIAGNOSIS — Z2821 Immunization not carried out because of patient refusal: Secondary | ICD-10-CM | POA: Diagnosis not present

## 2018-02-16 DIAGNOSIS — Z6820 Body mass index (BMI) 20.0-20.9, adult: Secondary | ICD-10-CM | POA: Diagnosis not present

## 2018-02-16 DIAGNOSIS — F17210 Nicotine dependence, cigarettes, uncomplicated: Secondary | ICD-10-CM | POA: Diagnosis not present

## 2018-02-16 DIAGNOSIS — K219 Gastro-esophageal reflux disease without esophagitis: Secondary | ICD-10-CM | POA: Diagnosis not present

## 2018-02-16 DIAGNOSIS — Z780 Asymptomatic menopausal state: Secondary | ICD-10-CM | POA: Diagnosis not present

## 2018-02-16 DIAGNOSIS — Z0000 Encounter for general adult medical examination without abnormal findings: Secondary | ICD-10-CM | POA: Diagnosis not present

## 2018-02-16 DIAGNOSIS — K635 Polyp of colon: Secondary | ICD-10-CM | POA: Diagnosis not present

## 2018-02-16 DIAGNOSIS — E782 Mixed hyperlipidemia: Secondary | ICD-10-CM | POA: Diagnosis not present

## 2018-02-24 DIAGNOSIS — M5417 Radiculopathy, lumbosacral region: Secondary | ICD-10-CM | POA: Diagnosis not present

## 2018-02-24 DIAGNOSIS — G44209 Tension-type headache, unspecified, not intractable: Secondary | ICD-10-CM | POA: Diagnosis not present

## 2018-02-24 DIAGNOSIS — M25559 Pain in unspecified hip: Secondary | ICD-10-CM | POA: Diagnosis not present

## 2018-02-24 DIAGNOSIS — M5412 Radiculopathy, cervical region: Secondary | ICD-10-CM | POA: Diagnosis not present

## 2018-02-24 DIAGNOSIS — G5603 Carpal tunnel syndrome, bilateral upper limbs: Secondary | ICD-10-CM | POA: Diagnosis not present

## 2018-02-24 DIAGNOSIS — R251 Tremor, unspecified: Secondary | ICD-10-CM | POA: Diagnosis not present

## 2018-03-23 DIAGNOSIS — Z6821 Body mass index (BMI) 21.0-21.9, adult: Secondary | ICD-10-CM | POA: Diagnosis not present

## 2018-03-23 DIAGNOSIS — M25552 Pain in left hip: Secondary | ICD-10-CM | POA: Diagnosis not present

## 2018-03-23 DIAGNOSIS — M810 Age-related osteoporosis without current pathological fracture: Secondary | ICD-10-CM | POA: Diagnosis not present

## 2018-03-23 DIAGNOSIS — M25551 Pain in right hip: Secondary | ICD-10-CM | POA: Diagnosis not present

## 2018-03-24 DIAGNOSIS — M5412 Radiculopathy, cervical region: Secondary | ICD-10-CM | POA: Diagnosis not present

## 2018-03-24 DIAGNOSIS — M5442 Lumbago with sciatica, left side: Secondary | ICD-10-CM | POA: Diagnosis not present

## 2018-03-24 DIAGNOSIS — R251 Tremor, unspecified: Secondary | ICD-10-CM | POA: Diagnosis not present

## 2018-03-24 DIAGNOSIS — M5441 Lumbago with sciatica, right side: Secondary | ICD-10-CM | POA: Diagnosis not present

## 2018-03-24 DIAGNOSIS — R2689 Other abnormalities of gait and mobility: Secondary | ICD-10-CM | POA: Diagnosis not present

## 2018-03-24 DIAGNOSIS — G44209 Tension-type headache, unspecified, not intractable: Secondary | ICD-10-CM | POA: Diagnosis not present

## 2018-04-02 DIAGNOSIS — K219 Gastro-esophageal reflux disease without esophagitis: Secondary | ICD-10-CM | POA: Diagnosis not present

## 2018-04-02 DIAGNOSIS — M5412 Radiculopathy, cervical region: Secondary | ICD-10-CM | POA: Diagnosis not present

## 2018-04-02 DIAGNOSIS — E782 Mixed hyperlipidemia: Secondary | ICD-10-CM | POA: Diagnosis not present

## 2018-04-08 DIAGNOSIS — M1612 Unilateral primary osteoarthritis, left hip: Secondary | ICD-10-CM | POA: Diagnosis not present

## 2018-04-08 DIAGNOSIS — M160 Bilateral primary osteoarthritis of hip: Secondary | ICD-10-CM | POA: Diagnosis not present

## 2018-04-08 DIAGNOSIS — M1611 Unilateral primary osteoarthritis, right hip: Secondary | ICD-10-CM | POA: Diagnosis not present

## 2018-04-14 DIAGNOSIS — M1612 Unilateral primary osteoarthritis, left hip: Secondary | ICD-10-CM | POA: Diagnosis not present

## 2018-04-14 DIAGNOSIS — M160 Bilateral primary osteoarthritis of hip: Secondary | ICD-10-CM | POA: Diagnosis not present

## 2018-05-03 DIAGNOSIS — K219 Gastro-esophageal reflux disease without esophagitis: Secondary | ICD-10-CM | POA: Diagnosis not present

## 2018-05-03 DIAGNOSIS — E782 Mixed hyperlipidemia: Secondary | ICD-10-CM | POA: Diagnosis not present

## 2018-05-11 DIAGNOSIS — Z6822 Body mass index (BMI) 22.0-22.9, adult: Secondary | ICD-10-CM | POA: Diagnosis not present

## 2018-05-11 DIAGNOSIS — H6692 Otitis media, unspecified, left ear: Secondary | ICD-10-CM | POA: Diagnosis not present

## 2018-05-11 DIAGNOSIS — J329 Chronic sinusitis, unspecified: Secondary | ICD-10-CM | POA: Diagnosis not present

## 2018-05-16 DIAGNOSIS — E782 Mixed hyperlipidemia: Secondary | ICD-10-CM | POA: Diagnosis not present

## 2018-05-16 DIAGNOSIS — G4700 Insomnia, unspecified: Secondary | ICD-10-CM | POA: Diagnosis not present

## 2018-05-16 DIAGNOSIS — Z6822 Body mass index (BMI) 22.0-22.9, adult: Secondary | ICD-10-CM | POA: Diagnosis not present

## 2018-05-16 DIAGNOSIS — M1612 Unilateral primary osteoarthritis, left hip: Secondary | ICD-10-CM | POA: Diagnosis not present

## 2018-05-16 DIAGNOSIS — F419 Anxiety disorder, unspecified: Secondary | ICD-10-CM | POA: Diagnosis not present

## 2018-05-24 DIAGNOSIS — M160 Bilateral primary osteoarthritis of hip: Secondary | ICD-10-CM | POA: Diagnosis not present

## 2018-05-25 DIAGNOSIS — Z8673 Personal history of transient ischemic attack (TIA), and cerebral infarction without residual deficits: Secondary | ICD-10-CM | POA: Diagnosis not present

## 2018-05-25 DIAGNOSIS — F4323 Adjustment disorder with mixed anxiety and depressed mood: Secondary | ICD-10-CM | POA: Diagnosis not present

## 2018-05-25 DIAGNOSIS — Z6821 Body mass index (BMI) 21.0-21.9, adult: Secondary | ICD-10-CM | POA: Diagnosis not present

## 2018-05-25 DIAGNOSIS — R2689 Other abnormalities of gait and mobility: Secondary | ICD-10-CM | POA: Diagnosis not present

## 2018-05-25 DIAGNOSIS — H00019 Hordeolum externum unspecified eye, unspecified eyelid: Secondary | ICD-10-CM | POA: Diagnosis not present

## 2018-05-25 DIAGNOSIS — R040 Epistaxis: Secondary | ICD-10-CM | POA: Diagnosis not present

## 2018-05-31 DIAGNOSIS — R2689 Other abnormalities of gait and mobility: Secondary | ICD-10-CM | POA: Diagnosis not present

## 2018-05-31 DIAGNOSIS — G518 Other disorders of facial nerve: Secondary | ICD-10-CM | POA: Diagnosis not present

## 2018-05-31 DIAGNOSIS — G930 Cerebral cysts: Secondary | ICD-10-CM | POA: Diagnosis not present

## 2018-05-31 DIAGNOSIS — J323 Chronic sphenoidal sinusitis: Secondary | ICD-10-CM | POA: Diagnosis not present

## 2018-06-02 DIAGNOSIS — E782 Mixed hyperlipidemia: Secondary | ICD-10-CM | POA: Diagnosis not present

## 2018-06-02 DIAGNOSIS — G4700 Insomnia, unspecified: Secondary | ICD-10-CM | POA: Diagnosis not present

## 2018-06-02 DIAGNOSIS — F419 Anxiety disorder, unspecified: Secondary | ICD-10-CM | POA: Diagnosis not present

## 2018-06-14 DIAGNOSIS — Z8673 Personal history of transient ischemic attack (TIA), and cerebral infarction without residual deficits: Secondary | ICD-10-CM | POA: Diagnosis not present

## 2018-06-14 DIAGNOSIS — M1612 Unilateral primary osteoarthritis, left hip: Secondary | ICD-10-CM | POA: Diagnosis not present

## 2018-06-14 DIAGNOSIS — Z6822 Body mass index (BMI) 22.0-22.9, adult: Secondary | ICD-10-CM | POA: Diagnosis not present

## 2018-06-16 DIAGNOSIS — M25559 Pain in unspecified hip: Secondary | ICD-10-CM | POA: Diagnosis not present

## 2018-06-16 DIAGNOSIS — M545 Low back pain: Secondary | ICD-10-CM | POA: Diagnosis not present

## 2018-06-16 DIAGNOSIS — G5603 Carpal tunnel syndrome, bilateral upper limbs: Secondary | ICD-10-CM | POA: Diagnosis not present

## 2018-06-16 DIAGNOSIS — M542 Cervicalgia: Secondary | ICD-10-CM | POA: Diagnosis not present

## 2018-06-16 DIAGNOSIS — R251 Tremor, unspecified: Secondary | ICD-10-CM | POA: Diagnosis not present

## 2018-06-22 DIAGNOSIS — M79609 Pain in unspecified limb: Secondary | ICD-10-CM | POA: Diagnosis not present

## 2018-06-22 DIAGNOSIS — Z01818 Encounter for other preprocedural examination: Secondary | ICD-10-CM | POA: Diagnosis not present

## 2018-06-22 DIAGNOSIS — Z01811 Encounter for preprocedural respiratory examination: Secondary | ICD-10-CM | POA: Diagnosis not present

## 2018-06-22 DIAGNOSIS — Z79899 Other long term (current) drug therapy: Secondary | ICD-10-CM | POA: Diagnosis not present

## 2018-06-22 DIAGNOSIS — R52 Pain, unspecified: Secondary | ICD-10-CM | POA: Diagnosis not present

## 2018-07-02 DIAGNOSIS — E782 Mixed hyperlipidemia: Secondary | ICD-10-CM | POA: Diagnosis not present

## 2018-07-02 DIAGNOSIS — K219 Gastro-esophageal reflux disease without esophagitis: Secondary | ICD-10-CM | POA: Diagnosis not present

## 2018-08-02 DIAGNOSIS — M1612 Unilateral primary osteoarthritis, left hip: Secondary | ICD-10-CM | POA: Diagnosis not present

## 2018-08-02 DIAGNOSIS — M810 Age-related osteoporosis without current pathological fracture: Secondary | ICD-10-CM | POA: Diagnosis not present

## 2018-08-05 DIAGNOSIS — M1611 Unilateral primary osteoarthritis, right hip: Secondary | ICD-10-CM | POA: Diagnosis not present

## 2018-08-25 DIAGNOSIS — M545 Low back pain: Secondary | ICD-10-CM | POA: Diagnosis not present

## 2018-08-25 DIAGNOSIS — G44209 Tension-type headache, unspecified, not intractable: Secondary | ICD-10-CM | POA: Diagnosis not present

## 2018-08-25 DIAGNOSIS — G5603 Carpal tunnel syndrome, bilateral upper limbs: Secondary | ICD-10-CM | POA: Diagnosis not present

## 2018-08-25 DIAGNOSIS — Z79899 Other long term (current) drug therapy: Secondary | ICD-10-CM | POA: Diagnosis not present

## 2018-08-25 DIAGNOSIS — R251 Tremor, unspecified: Secondary | ICD-10-CM | POA: Diagnosis not present

## 2018-08-25 DIAGNOSIS — M542 Cervicalgia: Secondary | ICD-10-CM | POA: Diagnosis not present

## 2018-09-01 DIAGNOSIS — E782 Mixed hyperlipidemia: Secondary | ICD-10-CM | POA: Diagnosis not present

## 2018-09-01 DIAGNOSIS — F419 Anxiety disorder, unspecified: Secondary | ICD-10-CM | POA: Diagnosis not present

## 2018-09-28 DIAGNOSIS — Z01818 Encounter for other preprocedural examination: Secondary | ICD-10-CM | POA: Diagnosis not present

## 2018-09-28 DIAGNOSIS — E559 Vitamin D deficiency, unspecified: Secondary | ICD-10-CM | POA: Diagnosis not present

## 2018-09-28 DIAGNOSIS — M79609 Pain in unspecified limb: Secondary | ICD-10-CM | POA: Diagnosis not present

## 2018-09-28 DIAGNOSIS — R52 Pain, unspecified: Secondary | ICD-10-CM | POA: Diagnosis not present

## 2018-09-28 DIAGNOSIS — M1612 Unilateral primary osteoarthritis, left hip: Secondary | ICD-10-CM | POA: Diagnosis not present

## 2018-09-28 DIAGNOSIS — Z79899 Other long term (current) drug therapy: Secondary | ICD-10-CM | POA: Diagnosis not present

## 2018-10-01 DIAGNOSIS — F419 Anxiety disorder, unspecified: Secondary | ICD-10-CM | POA: Diagnosis not present

## 2018-10-01 DIAGNOSIS — E782 Mixed hyperlipidemia: Secondary | ICD-10-CM | POA: Diagnosis not present

## 2018-10-03 DIAGNOSIS — M5136 Other intervertebral disc degeneration, lumbar region: Secondary | ICD-10-CM | POA: Diagnosis not present

## 2018-10-03 DIAGNOSIS — K589 Irritable bowel syndrome without diarrhea: Secondary | ICD-10-CM | POA: Diagnosis not present

## 2018-10-03 DIAGNOSIS — E785 Hyperlipidemia, unspecified: Secondary | ICD-10-CM | POA: Diagnosis not present

## 2018-10-03 DIAGNOSIS — Z87891 Personal history of nicotine dependence: Secondary | ICD-10-CM | POA: Diagnosis not present

## 2018-10-03 DIAGNOSIS — F419 Anxiety disorder, unspecified: Secondary | ICD-10-CM | POA: Diagnosis not present

## 2018-10-03 DIAGNOSIS — Z23 Encounter for immunization: Secondary | ICD-10-CM | POA: Diagnosis not present

## 2018-10-03 DIAGNOSIS — Z471 Aftercare following joint replacement surgery: Secondary | ICD-10-CM | POA: Diagnosis not present

## 2018-10-03 DIAGNOSIS — Z79899 Other long term (current) drug therapy: Secondary | ICD-10-CM | POA: Diagnosis not present

## 2018-10-03 DIAGNOSIS — M1612 Unilateral primary osteoarthritis, left hip: Secondary | ICD-10-CM | POA: Diagnosis not present

## 2018-10-03 DIAGNOSIS — K219 Gastro-esophageal reflux disease without esophagitis: Secondary | ICD-10-CM | POA: Diagnosis not present

## 2018-10-03 DIAGNOSIS — J302 Other seasonal allergic rhinitis: Secondary | ICD-10-CM | POA: Diagnosis not present

## 2018-10-03 DIAGNOSIS — Z8673 Personal history of transient ischemic attack (TIA), and cerebral infarction without residual deficits: Secondary | ICD-10-CM | POA: Diagnosis not present

## 2018-10-03 DIAGNOSIS — Z96642 Presence of left artificial hip joint: Secondary | ICD-10-CM | POA: Diagnosis not present

## 2018-10-03 DIAGNOSIS — Z79891 Long term (current) use of opiate analgesic: Secondary | ICD-10-CM | POA: Diagnosis not present

## 2018-10-13 DIAGNOSIS — Z6823 Body mass index (BMI) 23.0-23.9, adult: Secondary | ICD-10-CM | POA: Diagnosis not present

## 2018-10-13 DIAGNOSIS — Z7689 Persons encountering health services in other specified circumstances: Secondary | ICD-10-CM | POA: Diagnosis not present

## 2018-10-13 DIAGNOSIS — Z96642 Presence of left artificial hip joint: Secondary | ICD-10-CM | POA: Diagnosis not present

## 2018-11-01 DIAGNOSIS — E782 Mixed hyperlipidemia: Secondary | ICD-10-CM | POA: Diagnosis not present

## 2018-11-01 DIAGNOSIS — F419 Anxiety disorder, unspecified: Secondary | ICD-10-CM | POA: Diagnosis not present

## 2018-11-03 DIAGNOSIS — R251 Tremor, unspecified: Secondary | ICD-10-CM | POA: Diagnosis not present

## 2018-11-03 DIAGNOSIS — G44209 Tension-type headache, unspecified, not intractable: Secondary | ICD-10-CM | POA: Diagnosis not present

## 2018-11-03 DIAGNOSIS — Z79899 Other long term (current) drug therapy: Secondary | ICD-10-CM | POA: Diagnosis not present

## 2018-11-03 DIAGNOSIS — M542 Cervicalgia: Secondary | ICD-10-CM | POA: Diagnosis not present

## 2018-11-03 DIAGNOSIS — M5442 Lumbago with sciatica, left side: Secondary | ICD-10-CM | POA: Diagnosis not present

## 2018-11-03 DIAGNOSIS — M5441 Lumbago with sciatica, right side: Secondary | ICD-10-CM | POA: Diagnosis not present

## 2018-11-03 DIAGNOSIS — M5481 Occipital neuralgia: Secondary | ICD-10-CM | POA: Diagnosis not present

## 2018-11-29 DIAGNOSIS — M1612 Unilateral primary osteoarthritis, left hip: Secondary | ICD-10-CM | POA: Diagnosis not present

## 2018-11-29 DIAGNOSIS — M1611 Unilateral primary osteoarthritis, right hip: Secondary | ICD-10-CM | POA: Diagnosis not present

## 2018-11-29 DIAGNOSIS — M160 Bilateral primary osteoarthritis of hip: Secondary | ICD-10-CM | POA: Diagnosis not present

## 2018-11-29 DIAGNOSIS — Z96642 Presence of left artificial hip joint: Secondary | ICD-10-CM | POA: Diagnosis not present

## 2018-11-30 DIAGNOSIS — M1612 Unilateral primary osteoarthritis, left hip: Secondary | ICD-10-CM | POA: Diagnosis not present

## 2018-11-30 DIAGNOSIS — D649 Anemia, unspecified: Secondary | ICD-10-CM | POA: Diagnosis not present

## 2018-11-30 DIAGNOSIS — Z6822 Body mass index (BMI) 22.0-22.9, adult: Secondary | ICD-10-CM | POA: Diagnosis not present

## 2018-12-02 DIAGNOSIS — K219 Gastro-esophageal reflux disease without esophagitis: Secondary | ICD-10-CM | POA: Diagnosis not present

## 2018-12-02 DIAGNOSIS — F419 Anxiety disorder, unspecified: Secondary | ICD-10-CM | POA: Diagnosis not present

## 2018-12-23 DIAGNOSIS — Z6822 Body mass index (BMI) 22.0-22.9, adult: Secondary | ICD-10-CM | POA: Diagnosis not present

## 2018-12-23 DIAGNOSIS — E611 Iron deficiency: Secondary | ICD-10-CM | POA: Diagnosis not present

## 2019-01-02 DIAGNOSIS — F419 Anxiety disorder, unspecified: Secondary | ICD-10-CM | POA: Diagnosis not present

## 2019-01-02 DIAGNOSIS — K219 Gastro-esophageal reflux disease without esophagitis: Secondary | ICD-10-CM | POA: Diagnosis not present

## 2019-01-05 DIAGNOSIS — M79609 Pain in unspecified limb: Secondary | ICD-10-CM | POA: Diagnosis not present

## 2019-01-05 DIAGNOSIS — R52 Pain, unspecified: Secondary | ICD-10-CM | POA: Diagnosis not present

## 2019-01-05 DIAGNOSIS — M1611 Unilateral primary osteoarthritis, right hip: Secondary | ICD-10-CM | POA: Diagnosis not present

## 2019-01-05 DIAGNOSIS — Z01818 Encounter for other preprocedural examination: Secondary | ICD-10-CM | POA: Diagnosis not present

## 2019-01-05 DIAGNOSIS — E559 Vitamin D deficiency, unspecified: Secondary | ICD-10-CM | POA: Diagnosis not present

## 2019-01-05 DIAGNOSIS — Z79899 Other long term (current) drug therapy: Secondary | ICD-10-CM | POA: Diagnosis not present

## 2019-01-09 DIAGNOSIS — M47816 Spondylosis without myelopathy or radiculopathy, lumbar region: Secondary | ICD-10-CM | POA: Diagnosis not present

## 2019-01-09 DIAGNOSIS — M4316 Spondylolisthesis, lumbar region: Secondary | ICD-10-CM | POA: Diagnosis not present

## 2019-01-09 DIAGNOSIS — Z96642 Presence of left artificial hip joint: Secondary | ICD-10-CM | POA: Diagnosis not present

## 2019-01-09 DIAGNOSIS — M5489 Other dorsalgia: Secondary | ICD-10-CM | POA: Diagnosis not present

## 2019-01-09 DIAGNOSIS — Z471 Aftercare following joint replacement surgery: Secondary | ICD-10-CM | POA: Diagnosis not present

## 2019-01-09 DIAGNOSIS — M549 Dorsalgia, unspecified: Secondary | ICD-10-CM | POA: Diagnosis not present

## 2019-01-09 DIAGNOSIS — M5126 Other intervertebral disc displacement, lumbar region: Secondary | ICD-10-CM | POA: Diagnosis not present

## 2019-01-12 DIAGNOSIS — R251 Tremor, unspecified: Secondary | ICD-10-CM | POA: Diagnosis not present

## 2019-01-12 DIAGNOSIS — M542 Cervicalgia: Secondary | ICD-10-CM | POA: Diagnosis not present

## 2019-01-12 DIAGNOSIS — Z79899 Other long term (current) drug therapy: Secondary | ICD-10-CM | POA: Diagnosis not present

## 2019-01-12 DIAGNOSIS — G44209 Tension-type headache, unspecified, not intractable: Secondary | ICD-10-CM | POA: Diagnosis not present

## 2019-01-12 DIAGNOSIS — M545 Low back pain: Secondary | ICD-10-CM | POA: Diagnosis not present

## 2019-01-12 DIAGNOSIS — M5481 Occipital neuralgia: Secondary | ICD-10-CM | POA: Diagnosis not present

## 2019-01-16 DIAGNOSIS — M25551 Pain in right hip: Secondary | ICD-10-CM | POA: Diagnosis not present

## 2019-01-16 DIAGNOSIS — M1611 Unilateral primary osteoarthritis, right hip: Secondary | ICD-10-CM | POA: Diagnosis not present

## 2019-01-25 DIAGNOSIS — Z87891 Personal history of nicotine dependence: Secondary | ICD-10-CM | POA: Diagnosis not present

## 2019-01-25 DIAGNOSIS — M1611 Unilateral primary osteoarthritis, right hip: Secondary | ICD-10-CM | POA: Diagnosis not present

## 2019-01-25 DIAGNOSIS — E785 Hyperlipidemia, unspecified: Secondary | ICD-10-CM | POA: Diagnosis not present

## 2019-01-25 DIAGNOSIS — Z8673 Personal history of transient ischemic attack (TIA), and cerebral infarction without residual deficits: Secondary | ICD-10-CM | POA: Diagnosis not present

## 2019-01-25 DIAGNOSIS — Z96641 Presence of right artificial hip joint: Secondary | ICD-10-CM | POA: Diagnosis not present

## 2019-01-25 DIAGNOSIS — F419 Anxiety disorder, unspecified: Secondary | ICD-10-CM | POA: Diagnosis not present

## 2019-01-25 DIAGNOSIS — Z471 Aftercare following joint replacement surgery: Secondary | ICD-10-CM | POA: Diagnosis not present

## 2019-01-25 DIAGNOSIS — E119 Type 2 diabetes mellitus without complications: Secondary | ICD-10-CM | POA: Diagnosis not present

## 2019-01-25 DIAGNOSIS — F17210 Nicotine dependence, cigarettes, uncomplicated: Secondary | ICD-10-CM | POA: Diagnosis not present

## 2019-01-25 DIAGNOSIS — Z79899 Other long term (current) drug therapy: Secondary | ICD-10-CM | POA: Diagnosis not present

## 2019-01-25 DIAGNOSIS — J449 Chronic obstructive pulmonary disease, unspecified: Secondary | ICD-10-CM | POA: Diagnosis not present

## 2019-01-25 DIAGNOSIS — K219 Gastro-esophageal reflux disease without esophagitis: Secondary | ICD-10-CM | POA: Diagnosis not present

## 2019-01-27 DIAGNOSIS — E785 Hyperlipidemia, unspecified: Secondary | ICD-10-CM | POA: Diagnosis not present

## 2019-01-27 DIAGNOSIS — Z85828 Personal history of other malignant neoplasm of skin: Secondary | ICD-10-CM | POA: Diagnosis not present

## 2019-01-27 DIAGNOSIS — K5790 Diverticulosis of intestine, part unspecified, without perforation or abscess without bleeding: Secondary | ICD-10-CM | POA: Diagnosis not present

## 2019-01-27 DIAGNOSIS — Z981 Arthrodesis status: Secondary | ICD-10-CM | POA: Diagnosis not present

## 2019-01-27 DIAGNOSIS — Z8673 Personal history of transient ischemic attack (TIA), and cerebral infarction without residual deficits: Secondary | ICD-10-CM | POA: Diagnosis not present

## 2019-01-27 DIAGNOSIS — Z7982 Long term (current) use of aspirin: Secondary | ICD-10-CM | POA: Diagnosis not present

## 2019-01-27 DIAGNOSIS — Z86010 Personal history of colonic polyps: Secondary | ICD-10-CM | POA: Diagnosis not present

## 2019-01-27 DIAGNOSIS — Z87891 Personal history of nicotine dependence: Secondary | ICD-10-CM | POA: Diagnosis not present

## 2019-01-27 DIAGNOSIS — K589 Irritable bowel syndrome without diarrhea: Secondary | ICD-10-CM | POA: Diagnosis not present

## 2019-01-27 DIAGNOSIS — M5136 Other intervertebral disc degeneration, lumbar region: Secondary | ICD-10-CM | POA: Diagnosis not present

## 2019-01-27 DIAGNOSIS — F329 Major depressive disorder, single episode, unspecified: Secondary | ICD-10-CM | POA: Diagnosis not present

## 2019-01-27 DIAGNOSIS — Z96643 Presence of artificial hip joint, bilateral: Secondary | ICD-10-CM | POA: Diagnosis not present

## 2019-01-27 DIAGNOSIS — Z471 Aftercare following joint replacement surgery: Secondary | ICD-10-CM | POA: Diagnosis not present

## 2019-01-27 DIAGNOSIS — K219 Gastro-esophageal reflux disease without esophagitis: Secondary | ICD-10-CM | POA: Diagnosis not present

## 2019-01-27 DIAGNOSIS — Z90710 Acquired absence of both cervix and uterus: Secondary | ICD-10-CM | POA: Diagnosis not present

## 2019-01-27 DIAGNOSIS — F419 Anxiety disorder, unspecified: Secondary | ICD-10-CM | POA: Diagnosis not present

## 2019-02-01 DIAGNOSIS — F419 Anxiety disorder, unspecified: Secondary | ICD-10-CM | POA: Diagnosis not present

## 2019-02-01 DIAGNOSIS — E782 Mixed hyperlipidemia: Secondary | ICD-10-CM | POA: Diagnosis not present

## 2019-02-23 DIAGNOSIS — M160 Bilateral primary osteoarthritis of hip: Secondary | ICD-10-CM | POA: Diagnosis not present

## 2019-03-03 DIAGNOSIS — F419 Anxiety disorder, unspecified: Secondary | ICD-10-CM | POA: Diagnosis not present

## 2019-03-03 DIAGNOSIS — K219 Gastro-esophageal reflux disease without esophagitis: Secondary | ICD-10-CM | POA: Diagnosis not present

## 2019-03-09 DIAGNOSIS — R251 Tremor, unspecified: Secondary | ICD-10-CM | POA: Diagnosis not present

## 2019-03-09 DIAGNOSIS — M545 Low back pain: Secondary | ICD-10-CM | POA: Diagnosis not present

## 2019-03-09 DIAGNOSIS — M542 Cervicalgia: Secondary | ICD-10-CM | POA: Diagnosis not present

## 2019-03-09 DIAGNOSIS — Z79899 Other long term (current) drug therapy: Secondary | ICD-10-CM | POA: Diagnosis not present

## 2019-03-09 DIAGNOSIS — G4701 Insomnia due to medical condition: Secondary | ICD-10-CM | POA: Diagnosis not present

## 2019-03-09 DIAGNOSIS — G44209 Tension-type headache, unspecified, not intractable: Secondary | ICD-10-CM | POA: Diagnosis not present

## 2019-03-16 ENCOUNTER — Encounter: Payer: Self-pay | Admitting: *Deleted

## 2019-03-23 DIAGNOSIS — M5412 Radiculopathy, cervical region: Secondary | ICD-10-CM | POA: Diagnosis not present

## 2019-03-23 DIAGNOSIS — Z720 Tobacco use: Secondary | ICD-10-CM | POA: Diagnosis not present

## 2019-03-23 DIAGNOSIS — Z23 Encounter for immunization: Secondary | ICD-10-CM | POA: Diagnosis not present

## 2019-03-23 DIAGNOSIS — K219 Gastro-esophageal reflux disease without esophagitis: Secondary | ICD-10-CM | POA: Diagnosis not present

## 2019-03-23 DIAGNOSIS — G8929 Other chronic pain: Secondary | ICD-10-CM | POA: Diagnosis not present

## 2019-03-23 DIAGNOSIS — R002 Palpitations: Secondary | ICD-10-CM | POA: Diagnosis not present

## 2019-03-23 DIAGNOSIS — M5416 Radiculopathy, lumbar region: Secondary | ICD-10-CM | POA: Diagnosis not present

## 2019-03-23 DIAGNOSIS — Z8673 Personal history of transient ischemic attack (TIA), and cerebral infarction without residual deficits: Secondary | ICD-10-CM | POA: Diagnosis not present

## 2019-03-23 DIAGNOSIS — Z13220 Encounter for screening for lipoid disorders: Secondary | ICD-10-CM | POA: Diagnosis not present

## 2019-03-23 DIAGNOSIS — Z0000 Encounter for general adult medical examination without abnormal findings: Secondary | ICD-10-CM | POA: Diagnosis not present

## 2019-03-23 DIAGNOSIS — Z6821 Body mass index (BMI) 21.0-21.9, adult: Secondary | ICD-10-CM | POA: Diagnosis not present

## 2019-03-23 DIAGNOSIS — G4700 Insomnia, unspecified: Secondary | ICD-10-CM | POA: Diagnosis not present

## 2019-04-03 DIAGNOSIS — E782 Mixed hyperlipidemia: Secondary | ICD-10-CM | POA: Diagnosis not present

## 2019-04-03 DIAGNOSIS — F419 Anxiety disorder, unspecified: Secondary | ICD-10-CM | POA: Diagnosis not present

## 2019-04-06 DIAGNOSIS — M160 Bilateral primary osteoarthritis of hip: Secondary | ICD-10-CM | POA: Diagnosis not present

## 2019-04-15 DIAGNOSIS — R002 Palpitations: Secondary | ICD-10-CM | POA: Diagnosis not present

## 2019-04-16 DIAGNOSIS — H01004 Unspecified blepharitis left upper eyelid: Secondary | ICD-10-CM | POA: Diagnosis not present

## 2019-04-24 DIAGNOSIS — G4700 Insomnia, unspecified: Secondary | ICD-10-CM | POA: Diagnosis not present

## 2019-04-24 DIAGNOSIS — H00019 Hordeolum externum unspecified eye, unspecified eyelid: Secondary | ICD-10-CM | POA: Diagnosis not present

## 2019-04-24 DIAGNOSIS — F419 Anxiety disorder, unspecified: Secondary | ICD-10-CM | POA: Diagnosis not present

## 2019-05-04 DIAGNOSIS — L270 Generalized skin eruption due to drugs and medicaments taken internally: Secondary | ICD-10-CM | POA: Diagnosis not present

## 2019-05-04 DIAGNOSIS — Z6823 Body mass index (BMI) 23.0-23.9, adult: Secondary | ICD-10-CM | POA: Diagnosis not present

## 2019-05-04 DIAGNOSIS — E782 Mixed hyperlipidemia: Secondary | ICD-10-CM | POA: Diagnosis not present

## 2019-05-04 DIAGNOSIS — H00019 Hordeolum externum unspecified eye, unspecified eyelid: Secondary | ICD-10-CM | POA: Diagnosis not present

## 2019-05-04 DIAGNOSIS — F419 Anxiety disorder, unspecified: Secondary | ICD-10-CM | POA: Diagnosis not present

## 2019-05-05 DIAGNOSIS — M810 Age-related osteoporosis without current pathological fracture: Secondary | ICD-10-CM

## 2019-05-05 HISTORY — DX: Age-related osteoporosis without current pathological fracture: M81.0

## 2019-05-08 DIAGNOSIS — H00014 Hordeolum externum left upper eyelid: Secondary | ICD-10-CM | POA: Diagnosis not present

## 2019-05-18 DIAGNOSIS — M542 Cervicalgia: Secondary | ICD-10-CM | POA: Diagnosis not present

## 2019-05-18 DIAGNOSIS — R251 Tremor, unspecified: Secondary | ICD-10-CM | POA: Diagnosis not present

## 2019-05-18 DIAGNOSIS — Z79899 Other long term (current) drug therapy: Secondary | ICD-10-CM | POA: Diagnosis not present

## 2019-05-18 DIAGNOSIS — R260 Ataxic gait: Secondary | ICD-10-CM | POA: Diagnosis not present

## 2019-05-18 DIAGNOSIS — M545 Low back pain: Secondary | ICD-10-CM | POA: Diagnosis not present

## 2019-05-19 DIAGNOSIS — H00019 Hordeolum externum unspecified eye, unspecified eyelid: Secondary | ICD-10-CM | POA: Diagnosis not present

## 2019-05-19 DIAGNOSIS — R634 Abnormal weight loss: Secondary | ICD-10-CM | POA: Diagnosis not present

## 2019-05-19 DIAGNOSIS — Z6821 Body mass index (BMI) 21.0-21.9, adult: Secondary | ICD-10-CM | POA: Diagnosis not present

## 2019-05-23 ENCOUNTER — Other Ambulatory Visit: Payer: Self-pay | Admitting: *Deleted

## 2019-05-23 ENCOUNTER — Encounter: Payer: Self-pay | Admitting: *Deleted

## 2019-05-24 ENCOUNTER — Ambulatory Visit: Payer: Medicare HMO | Admitting: Cardiology

## 2019-06-06 DIAGNOSIS — R079 Chest pain, unspecified: Secondary | ICD-10-CM | POA: Diagnosis not present

## 2019-06-06 DIAGNOSIS — Z20828 Contact with and (suspected) exposure to other viral communicable diseases: Secondary | ICD-10-CM | POA: Diagnosis not present

## 2019-06-06 DIAGNOSIS — R05 Cough: Secondary | ICD-10-CM | POA: Diagnosis not present

## 2019-06-06 DIAGNOSIS — R438 Other disturbances of smell and taste: Secondary | ICD-10-CM | POA: Diagnosis not present

## 2019-06-14 ENCOUNTER — Ambulatory Visit: Payer: Medicare HMO | Admitting: Cardiology

## 2019-06-21 ENCOUNTER — Encounter: Payer: Self-pay | Admitting: Cardiology

## 2019-06-21 ENCOUNTER — Ambulatory Visit (INDEPENDENT_AMBULATORY_CARE_PROVIDER_SITE_OTHER): Payer: Medicare HMO | Admitting: Cardiology

## 2019-06-21 ENCOUNTER — Other Ambulatory Visit: Payer: Self-pay

## 2019-06-21 ENCOUNTER — Encounter: Payer: Self-pay | Admitting: *Deleted

## 2019-06-21 VITALS — BP 120/70 | HR 100 | Ht 62.0 in | Wt 115.0 lb

## 2019-06-21 DIAGNOSIS — Z720 Tobacco use: Secondary | ICD-10-CM

## 2019-06-21 DIAGNOSIS — E119 Type 2 diabetes mellitus without complications: Secondary | ICD-10-CM

## 2019-06-21 DIAGNOSIS — R072 Precordial pain: Secondary | ICD-10-CM

## 2019-06-21 DIAGNOSIS — R002 Palpitations: Secondary | ICD-10-CM

## 2019-06-21 DIAGNOSIS — R0602 Shortness of breath: Secondary | ICD-10-CM

## 2019-06-21 DIAGNOSIS — I493 Ventricular premature depolarization: Secondary | ICD-10-CM

## 2019-06-21 HISTORY — DX: Shortness of breath: R06.02

## 2019-06-21 HISTORY — DX: Palpitations: R00.2

## 2019-06-21 HISTORY — DX: Ventricular premature depolarization: I49.3

## 2019-06-21 HISTORY — DX: Precordial pain: R07.2

## 2019-06-21 MED ORDER — METOPROLOL SUCCINATE ER 25 MG PO TB24: 12.5000 mg | ORAL_TABLET | Freq: Every day | ORAL | 1 refills | Status: DC

## 2019-06-21 MED ORDER — NITROGLYCERIN 0.4 MG SL SUBL: 0.4000 mg | SUBLINGUAL_TABLET | SUBLINGUAL | 3 refills | Status: DC | PRN

## 2019-06-21 NOTE — Progress Notes (Signed)
Cardiology Office Note:    Date:  06/21/2019   ID:  Nancy Chavez, DOB Apr 05, 1956, MRN [id number]  PCP:  [facility]  Cardiologist:  Berniece Salines, DO  Electrophysiologist:  None   Referring MD: Myrlene Broker, MD   Chief Complaint  Patient presents with  . Tachycardia    History of Present Illness:    Nancy Chavez is a 64 y.o. female with a hx of dyslipidemia, reported TIA history, type 2 diabetes and family history of premature coronary disease who is referred by her PCP to be evaluated for palpitations and chest pain.  The patient tells me she has been experiencing intermittent palpitations.  She described these palpitations as an abrupt onset of fast heartbeat which lasts between 5 to 10 minutes at times.  She notes that the episodes are getting more frequent and is becoming concerning.  In addition she tells me that she has been experiencing left-sided chest pain.  She describes her chest pain as intermittent nonradiating chest aching sensation which usually last less than 5 minutes prior to resolution.  The patient reports that during a family history of coronary disease she got concerned and she wanted to be evaluated for this.  She did see her PCP recently who recommended she see cardiology.  During our encounter today she does not have any chest pain.  She also tells me that recently she did wear a monitor for 14 days which was placed on her by her PCP and she was told that the results of the monitor were within normal limits.  I have requested these reports for my independent review.  Past Medical History:  Diagnosis Date  . Acquired spondylolisthesis 04/11/2012   Overview:  L4/5  . Anxiety 07/20/2017   Refill given but needs to get in to see Dr Unk Lightning for more refills.  . Asymptomatic microscopic hematuria 12/09/2016  . Backache 04/11/2012  . Benzodiazepine dependence ([place]) 07/12/2017  . Carpal tunnel syndrome of right wrist 09/03/2014  . Cerebral artery  occlusion with cerebral infarction ([place]) 08/10/2017   to stop smoking,  d/c coumadin,  begin asa qd  . Chronic back pain   . Chronic radicular cervical pain 08/10/2017   folowed by neurology  . Colitis 08/10/2017   continue meds per ER  . Cyst of thyroid 08/10/2017  . Diverticulosis 08/10/2017  . Drug withdrawal syndrome ([place]) 07/12/2017  . Dysphagia 05/19/2017  . Elevated glucose 08/10/2017  . Epigastric pain 07/29/2017  . Gastroesophageal reflux disease without esophagitis 08/10/2017  . Hiatal hernia 08/10/2017  . History of colon polyps 08/10/2017  . Hormone replacement therapy (HRT) 06/17/2017  . Hx-TIA (transient ischemic attack) 12/24/2014  . Hypercalcemia 08/10/2017  . Insomnia 07/20/2017  . Low thyroid stimulating hormone (TSH) level 01/20/2018  . Menopause 08/10/2017   script for prevo  . Mixed dyslipidemia 08/10/2017   at goal,  continue low fat low cholesterol diet.  . Mixed emotional features as adjustment reaction 08/10/2017  . Mixed hyperlipidemia 08/10/2017  . Neck pain 08/10/2017   Prednisone 20mg  2 qd x7d and Lortab 5mg  q4hrs prn #40. If pain persists or worsens may need MRI.  Marland Kitchen Pain in joint, pelvic region and thigh 04/11/2012  . Primary osteoarthritis of first carpometacarpal joint of right hand 09/03/2014  . Recurrent nephrolithiasis 12/13/2016  . Renal cyst 12/13/2016  . S/P lumbar fusion 05/13/2016  . Situational mixed anxiety and depressive disorder 08/10/2017  . Spinal stenosis of lumbar region without neurogenic claudication 04/11/2012  Overview:  L4/5  . Stress incontinence, female 08/10/2017  . Thoracic or lumbosacral neuritis or radiculitis 04/11/2012   Overview:  L4-S1 proven by EMG ICD-10 cut over   . Tobacco abuse 08/10/2017   stopped smoking 8 weeks ago-  currently taking chantix  . Transient ischemic attack (TIA)   . Type 2 diabetes mellitus with hyperglycemia, without long-term current use of insulin ([place]) 08/10/2017   A1C at goal,  continue ADA diet  . Weight loss, unintentional 01/20/2018     Past Surgical History:  Procedure Laterality Date  . ABDOMINAL HYSTERECTOMY    . BACK SURGERY    . CHOLECYSTECTOMY    . FOOT SURGERY    . HIP SURGERY Bilateral    Hip replacement  . TONSILLECTOMY      Current Medications: Current Meds  Medication Sig  . buPROPion (WELLBUTRIN SR) 100 MG 12 hr tablet 100 mg 2 (two) times daily.  . clonazePAM (KLONOPIN) 1 MG tablet Take 1 mg by mouth every evening.  . diclofenac (VOLTAREN) 75 MG EC tablet Take 75 mg by mouth 2 (two) times daily.  Marland Kitchen gabapentin (NEURONTIN) 600 MG tablet TAKE ONE TABLET BY MOUTH EVERY MORNING and TAKE TWO TABLETS BY MOUTH AT BEDTIME  . ibuprofen (ADVIL,MOTRIN) 200 MG tablet Take 800 mg by mouth every 6 (six) hours as needed for moderate pain.  . Multiple Vitamin (MULTIVITAMIN WITH MINERALS) TABS tablet Take 1 tablet by mouth daily.  Marland Kitchen neomycin-polymyxin b-dexamethasone (MAXITROL) 3.5-10000-0.1 OINT   . primidone (MYSOLINE) 50 MG tablet 50 mg daily.  . traMADol (ULTRAM) 50 MG tablet Take 50 mg by mouth 4 (four) times daily as needed.  . traZODone (DESYREL) 100 MG tablet 100 mg at bedtime as needed.     Allergies:   Oxycodone   Social History   Socioeconomic History  . Marital status: Married    Spouse name: Not on file  . Number of children: Not on file  . Years of education: Not on file  . Highest education level: Not on file  Occupational History  . Not on file  Tobacco Use  . Smoking status: Current Every Day Smoker    Types: Cigarettes  . Smokeless tobacco: Never Used  Substance and Sexual Activity  . Alcohol use: No  . Drug use: Not Currently    Types: Marijuana  . Sexual activity: Not on file  Other Topics Concern  . Not on file  Social History Narrative  . Not on file   Social Determinants of Health   Financial Resource Strain:   . Difficulty of Paying Living Expenses: Not on file  Food Insecurity:   . Worried About Charity fundraiser in the Last Year: Not on file  . Ran Out of Food in  the Last Year: Not on file  Transportation Needs:   . Lack of Transportation (Medical): Not on file  . Lack of Transportation (Non-Medical): Not on file  Physical Activity:   . Days of Exercise per Week: Not on file  . Minutes of Exercise per Session: Not on file  Stress:   . Feeling of Stress : Not on file  Social Connections:   . Frequency of Communication with Friends and Family: Not on file  . Frequency of Social Gatherings with Friends and Family: Not on file  . Attends Religious Services: Not on file  . Active Member of Clubs or Organizations: Not on file  . Attends Archivist Meetings: Not on file  . Marital  Status: Not on file     Family History: The patient's family history includes Cancer in her brother, brother, and sister; Diabetes in her brother; Heart failure in her mother.  ROS:   Review of Systems  Constitution: Negative for decreased appetite, fever and weight gain.  HENT: Negative for congestion, ear discharge, hoarse voice and sore throat.   Eyes: Negative for discharge, redness, vision loss in right eye and visual halos.  Cardiovascular: Reports chest pain and palpitations.  Negative for dyspnea on exertion, leg swelling, orthopnea. Respiratory: Negative for cough, hemoptysis, shortness of breath and snoring.   Endocrine: Negative for heat intolerance and polyphagia.  Hematologic/Lymphatic: Negative for bleeding problem. Does not bruise/bleed easily.  Skin: Negative for flushing, nail changes, rash and suspicious lesions.  Musculoskeletal: Negative for arthritis, joint pain, muscle cramps, myalgias, neck pain and stiffness.  Gastrointestinal: Negative for abdominal pain, bowel incontinence, diarrhea and excessive appetite.  Genitourinary: Negative for decreased libido, genital sores and incomplete emptying.  Neurological: Negative for brief paralysis, focal weakness, headaches and loss of balance.  Psychiatric/Behavioral: Negative for altered mental  status, depression and suicidal ideas.  Allergic/Immunologic: Negative for HIV exposure and persistent infections.    EKGs/Labs/Other Studies Reviewed:    The following studies were reviewed today:   EKG:  The ekg ordered today demonstrates sinus tachycardia, heart rate 100 bpm with frequent PVCs, nonspecific interventricular conduction defect prior to EKG done at the PCP office on March 23, 2019 at that time the patient was in sinus rhythm however interventricular conduction defect is present.  Recent Labs: No results found for requested labs within last 8760 hours.  Recent Lipid Panel No results found for: CHOL, TRIG, HDL, CHOLHDL, VLDL, LDLCALC, LDLDIRECT  Physical Exam:    VS:  BP 120/70 (BP Location: Right Arm, Patient Position: Sitting, Cuff Size: Normal)   Pulse 100   Ht 5\' 2"  (1.575 m)   Wt 115 lb (52.2 kg)   SpO2 95%   BMI 21.03 kg/m     Wt Readings from Last 3 Encounters:  06/21/19 115 lb (52.2 kg)  12/24/15 113 lb (51.3 kg)  02/27/14 126 lb (57.2 kg)     GEN: Well nourished, well developed in no acute distress HEENT: Normal NECK: No JVD; No carotid bruits LYMPHATICS: No lymphadenopathy CARDIAC: S1S2 noted,RRR, no murmurs, rubs, gallops RESPIRATORY:  Clear to auscultation without rales, wheezing or rhonchi  ABDOMEN: Soft, non-tender, non-distended, +bowel sounds, no guarding. EXTREMITIES: No edema, No cyanosis, no clubbing MUSCULOSKELETAL:  No deformity  SKIN: Warm and dry NEUROLOGIC:  Alert and oriented x 3, non-focal PSYCHIATRIC:  Normal affect, good insight  ASSESSMENT:    1. Shortness of breath   2. Precordial pain   3. Palpitations   4. PVC (premature ventricular contraction)   5. Tobacco abuse   6. Type 2 diabetes mellitus without complication, without long-term current use of insulin (HCC)    PLAN:    She does have significant risk factors for coronary disease, including family history, tobacco use with her current symptoms I would like to  pursue an ischemic evaluation.  I have talked to the patient about pharmacologic nuclear stress test.  I educated about this patient about this testing.  All of her questions has been answered.  She is agreeable to proceed with this testing.  Sublingual nitroglycerin prescription was sent, its protocol and 911 protocol explained and the patient vocalized understanding questions were answered to the patient's satisfaction  In addition with her shortness of breath and  her known smoking history I would like to get an echocardiogram to be able to assess to rule out any RV dysfunction.  This will also allow me to understand if there are any other structural abnormalities that could be causing her shortness of breath.  She recently wore ZIO monitor for 14 days with her PCP she reports that this was normal.  I have requested records to be able to independently review this monitor tracing.  Her EKG today showed frequent PVCs and she is tachycardic for now I will start the patient on low-dose Toprol-XL 12.5 mg to help with symptomatic relief.  Tobacco use-the patient was counseled on tobacco cessation today for 5 minutes.  Counseling included reviewing the risks of smoking tobacco products, how it impacts the patient's current medical diagnoses and different strategies for quitting.  Pharmacotherapy to aid in tobacco cessation was not prescribed today. The patient coordinate with  primary care provider.  The patient was also advised to call   1-800-QUIT-NOW 9851752312) for additional help with quitting smoking.  Type 2 diabetes-managed by PCP.  The patient is in agreement with the above plan. The patient left the office in stable condition.  The patient will follow up in 3 months or sooner if needed.  Total visit time 50 minutes.  Medication Adjustments/Labs and Tests Ordered: Current medicines are reviewed at length with the patient today.  Concerns regarding medicines are outlined above.  Orders  Placed This Encounter  Procedures  . MYOCARDIAL PERFUSION IMAGING  . EKG 12-Lead  . ECHOCARDIOGRAM COMPLETE   Meds ordered this encounter  Medications  . metoprolol succinate (TOPROL XL) 25 MG 24 hr tablet    Sig: Take 0.5 tablets (12.5 mg total) by mouth daily.    Dispense:  45 tablet    Refill:  1  . nitroGLYCERIN (NITROSTAT) 0.4 MG SL tablet    Sig: Place 1 tablet (0.4 mg total) under the tongue every 5 (five) minutes as needed.    Dispense:  30 tablet    Refill:  3    Patient Instructions  Medication Instructions:  Your physician has recommended you make the following change in your medication:   START Toprol XL(metoprolol succinate) 25 mg Take 1/2 tab daily  Nitroglycerin 0.4 mg sublingual (under your tongue) as needed for chest pain. If experiencing chest pain, stop what you are doing and sit down. Take 1 nitroglycerin and wait 5 minutes. If chest pain continues, take another nitroglycerin and wait 5 minutes. If chest pain does not subside, take 1 more nitroglycerin and dial 911. You make take a total of 3 nitroglycerin in a 15 minute time frame.   *If you need a refill on your cardiac medications before your next appointment, please call your pharmacy*  Lab Work: None If you have labs (blood work) drawn today and your tests are completely normal, you will receive your results only by: Marland Kitchen MyChart Message (if you have MyChart) OR . A paper copy in the mail If you have any lab test that is abnormal or we need to change your treatment, we will call you to review the results.  Testing/Procedures: Your physician has requested that you have an echocardiogram. Echocardiography is a painless test that uses sound waves to create images of your heart. It provides your doctor with information about the size and shape of your heart and how well your heart's chambers and valves are working. This procedure takes approximately one hour. There are no restrictions for this procedure.  Your  physician has requested that you have a lexiscan myoview. For further information please visit [email or web address]. Please follow instruction sheet, as given.    Follow-Up: At [facility], you and your health needs are our priority.  As part of our continuing mission to provide you with exceptional heart care, we have created designated Provider Care Teams.  These Care Teams include your primary Cardiologist (physician) and Advanced Practice Providers (APPs -  Physician Assistants and Nurse Practitioners) who all work together to provide you with the care you need, when you need it.  Your next appointment:   3 month(s)  The format for your next appointment:   In Person  Provider:   Berniece Salines, DO  Other Instructions      Adopting a Healthy Lifestyle.  Know what a healthy weight is for you (roughly BMI <25) and aim to maintain this   Aim for 7+ servings of fruits and vegetables daily   65-80+ fluid ounces of water or unsweet tea for healthy kidneys   Limit to max 1 drink of alcohol per day; avoid smoking/tobacco   Limit animal fats in diet for cholesterol and heart health - choose grass fed whenever available   Avoid highly processed foods, and foods high in saturated/trans fats   Aim for low stress - take time to unwind and care for your mental health   Aim for 150 min of moderate intensity exercise weekly for heart health, and weights twice weekly for bone health   Aim for 7-9 hours of sleep daily   When it comes to diets, agreement about the perfect plan isnt easy to find, even among the experts. Experts at the [employer] developed an idea known as the Healthy Eating Plate. Just imagine a plate divided into logical, healthy portions.   The emphasis is on diet quality:   Load up on vegetables and fruits - one-half of your plate: Aim for color and variety, and remember that potatoes dont count.   Go for whole grains - one-quarter of your  plate: Whole wheat, barley, wheat berries, quinoa, oats, brown rice, and foods made with them. If you want pasta, go with whole wheat pasta.   Protein power - one-quarter of your plate: Fish, chicken, beans, and nuts are all healthy, versatile protein sources. Limit red meat.   The diet, however, does go beyond the plate, offering a few other suggestions.   Use healthy plant oils, such as olive, canola, soy, corn, sunflower and peanut. Check the labels, and avoid partially hydrogenated oil, which have unhealthy trans fats.   If youre thirsty, drink water. Coffee and tea are good in moderation, but skip sugary drinks and limit milk and dairy products to one or two daily servings.   The type of carbohydrate in the diet is more important than the amount. Some sources of carbohydrates, such as vegetables, fruits, whole grains, and beans-are healthier than others.   Finally, stay active  Signed, Berniece Salines, DO  06/21/2019 10:11 PM    [facility]

## 2019-06-21 NOTE — Patient Instructions (Signed)
Medication Instructions:  Your physician has recommended you make the following change in your medication:   START Toprol XL(metoprolol succinate) 25 mg Take 1/2 tab daily  Nitroglycerin 0.4 mg sublingual (under your tongue) as needed for chest pain. If experiencing chest pain, stop what you are doing and sit down. Take 1 nitroglycerin and wait 5 minutes. If chest pain continues, take another nitroglycerin and wait 5 minutes. If chest pain does not subside, take 1 more nitroglycerin and dial 911. You make take a total of 3 nitroglycerin in a 15 minute time frame.   *If you need a refill on your cardiac medications before your next appointment, please call your pharmacy*  Lab Work: None If you have labs (blood work) drawn today and your tests are completely normal, you will receive your results only by: Marland Kitchen MyChart Message (if you have MyChart) OR . A paper copy in the mail If you have any lab test that is abnormal or we need to change your treatment, we will call you to review the results.  Testing/Procedures: Your physician has requested that you have an echocardiogram. Echocardiography is a painless test that uses sound waves to create images of your heart. It provides your doctor with information about the size and shape of your heart and how well your heart's chambers and valves are working. This procedure takes approximately one hour. There are no restrictions for this procedure.  Your physician has requested that you have a lexiscan myoview. For further information please visit [email or web address]. Please follow instruction sheet, as given.    Follow-Up: At [facility], you and your health needs are our priority.  As part of our continuing mission to provide you with exceptional heart care, we have created designated Provider Care Teams.  These Care Teams include your primary Cardiologist (physician) and Advanced Practice Providers (APPs -  Physician Assistants and Nurse  Practitioners) who all work together to provide you with the care you need, when you need it.  Your next appointment:   3 month(s)  The format for your next appointment:   In Person  Provider:   Berniece Salines, DO  Other Instructions

## 2019-06-26 ENCOUNTER — Telehealth: Payer: Self-pay | Admitting: Cardiology

## 2019-06-26 NOTE — Telephone Encounter (Signed)
Spoke with patient and scheduled her for OV on 2/26 @ 315pm

## 2019-06-26 NOTE — Telephone Encounter (Signed)
New message:    Patient calling stating that she is having a bad reaction from the medication Metoprolol. please call patient back.

## 2019-06-26 NOTE — Telephone Encounter (Signed)
Please have patient schedule with me in the next 2 weeks.  For now we will hold off on adding any meds additional medication until I see her.

## 2019-06-26 NOTE — Telephone Encounter (Signed)
Left message for patient to return call.

## 2019-06-26 NOTE — Telephone Encounter (Signed)
Spoke with patient of Dr. Harriet Masson. She was started on metoprolol and took for 2 days. She reports she broke out in raised, red places on arms, chest, stomach, back, groin. Last dose was Saturday but symptoms reappeared last night. She took benadryl OTC with relief.   Metoprolol succinate added to allergy list  Will route to Dr. Harriet Masson for advice on medication changes

## 2019-06-30 ENCOUNTER — Other Ambulatory Visit: Payer: Self-pay

## 2019-06-30 ENCOUNTER — Encounter: Payer: Self-pay | Admitting: Cardiology

## 2019-06-30 ENCOUNTER — Ambulatory Visit (INDEPENDENT_AMBULATORY_CARE_PROVIDER_SITE_OTHER): Payer: Medicare HMO | Admitting: Cardiology

## 2019-06-30 VITALS — BP 140/80 | HR 87 | Ht 62.0 in | Wt 118.0 lb

## 2019-06-30 DIAGNOSIS — Z720 Tobacco use: Secondary | ICD-10-CM

## 2019-06-30 DIAGNOSIS — T7840XA Allergy, unspecified, initial encounter: Secondary | ICD-10-CM | POA: Diagnosis not present

## 2019-06-30 DIAGNOSIS — I493 Ventricular premature depolarization: Secondary | ICD-10-CM

## 2019-06-30 DIAGNOSIS — E1165 Type 2 diabetes mellitus with hyperglycemia: Secondary | ICD-10-CM

## 2019-06-30 DIAGNOSIS — E782 Mixed hyperlipidemia: Secondary | ICD-10-CM | POA: Diagnosis not present

## 2019-06-30 NOTE — Patient Instructions (Addendum)
Medication Instructions:  Your physician recommends that you continue on your current medications as directed. Please refer to the Current Medication list given to you today.  *If you need a refill on your cardiac medications before your next appointment, please call your pharmacy*   Lab Work: None If you have labs (blood work) drawn today and your tests are completely normal, you will receive your results only by: Marland Kitchen MyChart Message (if you have MyChart) OR . A paper copy in the mail If you have any lab test that is abnormal or we need to change your treatment, we will call you to review the results.   Testing/Procedures: None   Follow-Up: At [facility], you and your health needs are our priority.  As part of our continuing mission to provide you with exceptional heart care, we have created designated Provider Care Teams.  These Care Teams include your primary Cardiologist (physician) and Advanced Practice Providers (APPs -  Physician Assistants and Nurse Practitioners) who all work together to provide you with the care you need, when you need it.  We recommend signing up for the patient portal called "MyChart".  Sign up information is provided on this After Visit Summary.  MyChart is used to connect with patients for Virtual Visits (Telemedicine).  Patients are able to view lab/test results, encounter notes, upcoming appointments, etc.  Non-urgent messages can be sent to your provider as well.   To learn more about what you can do with MyChart, go to [email or web address].    Your next appointment:   3 month(s) Keep 6/1 /21 appointment  The format for your next appointment:   In Person  Provider:   Berniece Salines, DO   Other Instructions

## 2019-06-30 NOTE — Progress Notes (Signed)
Cardiology Office Note:    Date:  06/30/2019   ID:  Nancy Chavez, DOB 08-22-1955, MRN [id number]  PCP:  [facility]  Cardiologist:  Berniece Salines, DO  Electrophysiologist:  None   Referring MD: [facility] [place]   Chief Complaint  Patient presents with  . Medication Problem    Metoprolol caused rash   Follow up due to medication reaction  History of Present Illness:    Nancy Chavez is a 64 y.o. female with a hx of hyperlipidemia, history of TIA, type 2 diabetes, family history of premature coronary artery disease.  The patient was initially seen by me on June 21, 2019 at the time she complained of applications, chest pain as well as shortness of breath.  At the conclusion her visit I ordered pharmacologic nuclear stress test given the patient risk factor, in addition an echocardiogram was performed.  This test is still pending. Due to PVCs noted on her EKG as well as significant symptom palpitation Toprol-XL 12.5 mg was ordered for the patient.  Patient called earlier this week reporting that she had had significant reaction to the blockers with skin rash and welts.  She was asked the meter stop the medication and she is here for follow-up visit.  Patient tells me that since she stopped the medication all of her rashes have resolved.  She denies any both airway any pending skin rashes.  No other complaints at this time.  Past Medical History:  Diagnosis Date  . Acquired spondylolisthesis 04/11/2012   Overview:  L4/5  . Anxiety 07/20/2017   Refill given but needs to get in to see Dr Unk Lightning for more refills.  . Asymptomatic microscopic hematuria 12/09/2016  . Backache 04/11/2012  . Benzodiazepine dependence ([place]) 07/12/2017  . Carpal tunnel syndrome of right wrist 09/03/2014  . Cerebral artery occlusion with cerebral infarction ([place]) 08/10/2017   to stop smoking,  d/c coumadin,  begin asa qd  . Chronic back pain   . Chronic radicular cervical pain 08/10/2017    folowed by neurology  . Colitis 08/10/2017   continue meds per ER  . Cyst of thyroid 08/10/2017  . Diverticulosis 08/10/2017  . Drug withdrawal syndrome ([place]) 07/12/2017  . Dysphagia 05/19/2017  . Elevated glucose 08/10/2017  . Epigastric pain 07/29/2017  . Gastroesophageal reflux disease without esophagitis 08/10/2017  . Hiatal hernia 08/10/2017  . History of colon polyps 08/10/2017  . Hormone replacement therapy (HRT) 06/17/2017  . Hx-TIA (transient ischemic attack) 12/24/2014  . Hypercalcemia 08/10/2017  . Insomnia 07/20/2017  . Low thyroid stimulating hormone (TSH) level 01/20/2018  . Menopause 08/10/2017   script for prevo  . Mixed dyslipidemia 08/10/2017   at goal,  continue low fat low cholesterol diet.  . Mixed emotional features as adjustment reaction 08/10/2017  . Mixed hyperlipidemia 08/10/2017  . Neck pain 08/10/2017   Prednisone 20mg  2 qd x7d and Lortab 5mg  q4hrs prn #40. If pain persists or worsens may need MRI.  Marland Kitchen Pain in joint, pelvic region and thigh 04/11/2012  . Primary osteoarthritis of first carpometacarpal joint of right hand 09/03/2014  . Recurrent nephrolithiasis 12/13/2016  . Renal cyst 12/13/2016  . S/P lumbar fusion 05/13/2016  . Situational mixed anxiety and depressive disorder 08/10/2017  . Spinal stenosis of lumbar region without neurogenic claudication 04/11/2012   Overview:  L4/5  . Stress incontinence, female 08/10/2017  . Thoracic or lumbosacral neuritis or radiculitis 04/11/2012   Overview:  L4-S1 proven by EMG ICD-10 cut  over   . Tobacco abuse 08/10/2017   stopped smoking 8 weeks ago-  currently taking chantix  . Transient ischemic attack (TIA)   . Type 2 diabetes mellitus with hyperglycemia, without long-term current use of insulin ([place]) 08/10/2017   A1C at goal,  continue ADA diet  . Weight loss, unintentional 01/20/2018    Past Surgical History:  Procedure Laterality Date  . ABDOMINAL HYSTERECTOMY    . BACK SURGERY    . CHOLECYSTECTOMY    . FOOT SURGERY    . HIP SURGERY  Bilateral    Hip replacement  . TONSILLECTOMY      Current Medications: Current Meds  Medication Sig  . buPROPion (WELLBUTRIN SR) 100 MG 12 hr tablet 100 mg 2 (two) times daily.  . clonazePAM (KLONOPIN) 1 MG tablet Take 1 mg by mouth every evening.  . diclofenac (VOLTAREN) 75 MG EC tablet Take 75 mg by mouth 2 (two) times daily.  Marland Kitchen gabapentin (NEURONTIN) 600 MG tablet TAKE ONE TABLET BY MOUTH EVERY MORNING and TAKE TWO TABLETS BY MOUTH AT BEDTIME  . ibuprofen (ADVIL,MOTRIN) 200 MG tablet Take 800 mg by mouth every 6 (six) hours as needed for moderate pain.  . Multiple Vitamin (MULTIVITAMIN WITH MINERALS) TABS tablet Take 1 tablet by mouth daily.  Marland Kitchen neomycin-polymyxin b-dexamethasone (MAXITROL) 3.5-10000-0.1 OINT   . nitroGLYCERIN (NITROSTAT) 0.4 MG SL tablet Place 1 tablet (0.4 mg total) under the tongue every 5 (five) minutes as needed.  . primidone (MYSOLINE) 50 MG tablet 50 mg daily.  . traMADol (ULTRAM) 50 MG tablet Take 50 mg by mouth 4 (four) times daily as needed.  . traZODone (DESYREL) 100 MG tablet 100 mg at bedtime as needed.     Allergies:   Oxycodone and Metoprolol succinate [metoprolol]   Social History   Socioeconomic History  . Marital status: Married    Spouse name: Not on file  . Number of children: Not on file  . Years of education: Not on file  . Highest education level: Not on file  Occupational History  . Not on file  Tobacco Use  . Smoking status: Current Every Day Smoker    Types: Cigarettes  . Smokeless tobacco: Never Used  Substance and Sexual Activity  . Alcohol use: No  . Drug use: Not Currently    Types: Marijuana  . Sexual activity: Not on file  Other Topics Concern  . Not on file  Social History Narrative  . Not on file   Social Determinants of Health   Financial Resource Strain:   . Difficulty of Paying Living Expenses: Not on file  Food Insecurity:   . Worried About Charity fundraiser in the Last Year: Not on file  . Ran Out of  Food in the Last Year: Not on file  Transportation Needs:   . Lack of Transportation (Medical): Not on file  . Lack of Transportation (Non-Medical): Not on file  Physical Activity:   . Days of Exercise per Week: Not on file  . Minutes of Exercise per Session: Not on file  Stress:   . Feeling of Stress : Not on file  Social Connections:   . Frequency of Communication with Friends and Family: Not on file  . Frequency of Social Gatherings with Friends and Family: Not on file  . Attends Religious Services: Not on file  . Active Member of Clubs or Organizations: Not on file  . Attends Archivist Meetings: Not on file  . Marital Status:  Not on file     Family History: The patient's family history includes Cancer in her brother, brother, and sister; Diabetes in her brother; Heart failure in her mother.  ROS:   Review of Systems  Constitution: Negative for decreased appetite, fever and weight gain.  HENT: Negative for congestion, ear discharge, hoarse voice and sore throat.   Eyes: Negative for discharge, redness, vision loss in right eye and visual halos.  Cardiovascular: Negative for chest pain, dyspnea on exertion, leg swelling, orthopnea and palpitations.  Respiratory: Negative for cough, hemoptysis, shortness of breath and snoring.   Endocrine: Negative for heat intolerance and polyphagia.  Hematologic/Lymphatic: Negative for bleeding problem. Does not bruise/bleed easily.  Skin: Negative for flushing, nail changes, rash and suspicious lesions.  Musculoskeletal: Negative for arthritis, joint pain, muscle cramps, myalgias, neck pain and stiffness.  Gastrointestinal: Negative for abdominal pain, bowel incontinence, diarrhea and excessive appetite.  Genitourinary: Negative for decreased libido, genital sores and incomplete emptying.  Neurological: Negative for brief paralysis, focal weakness, headaches and loss of balance.  Psychiatric/Behavioral: Negative for altered mental  status, depression and suicidal ideas.  Allergic/Immunologic: Negative for HIV exposure and persistent infections.    EKGs/Labs/Other Studies Reviewed:    The following studies were reviewed today:   EKG: None today    Recent Labs: No results found for requested labs within last 8760 hours.  Recent Lipid Panel No results found for: CHOL, TRIG, HDL, CHOLHDL, VLDL, LDLCALC, LDLDIRECT  Physical Exam:    VS:  BP 140/80 (BP Location: Right Arm, Patient Position: Sitting, Cuff Size: Normal)   Pulse 87   Ht 5\' 2"  (1.575 m)   Wt 118 lb (53.5 kg)   SpO2 94%   BMI 21.58 kg/m     Wt Readings from Last 3 Encounters:  06/30/19 118 lb (53.5 kg)  06/21/19 115 lb (52.2 kg)  12/24/15 113 lb (51.3 kg)     GEN: Well nourished, well developed in no acute distress HEENT: Normal NECK: No JVD; No carotid bruits LYMPHATICS: No lymphadenopathy CARDIAC: S1S2 noted,RRR, no murmurs, rubs, gallops RESPIRATORY:  Clear to auscultation without rales, wheezing or rhonchi  ABDOMEN: Soft, non-tender, non-distended, +bowel sounds, no guarding. EXTREMITIES: No edema, No cyanosis, no clubbing MUSCULOSKELETAL:  No deformity  SKIN: Warm and dry NEUROLOGIC:  Alert and oriented x 3, non-focal PSYCHIATRIC:  Normal affect, good insight  ASSESSMENT:    1. Allergic reaction to drug, initial encounter   2. PVC (premature ventricular contraction)   3. Type 2 diabetes mellitus with hyperglycemia, without long-term current use of insulin ([place])   4. Mixed hyperlipidemia   5. Tobacco abuse    PLAN:    She is back to her baseline.  In all of the rash has resolved since she stopped the medication.  Shared decision today we will hold off on adding any additional medication she tells me that she can tolerate the symptoms of her current palpitations.  Her stress test and echocardiogram are still pending.  I was able to review her monitor which was done at her PCP office and this was within normal limits.  The  patient was counseled on tobacco cessation today for 5 minutes.  Counseling included reviewing the risks of smoking tobacco products, how it impacts the patient's current medical diagnoses and different strategies for quitting.  Pharmacotherapy to aid in tobacco cessation was not prescribed today. The patient coordinate with  primary care provider.  The patient was also advised to call   1-800-QUIT-NOW 313-491-0550) for  additional help with quitting smoking.  The patient is in agreement with the above plan. The patient left the office in stable condition.  The patient will follow up in 3 months.   Medication Adjustments/Labs and Tests Ordered: Current medicines are reviewed at length with the patient today.  Concerns regarding medicines are outlined above.  No orders of the defined types were placed in this encounter.  No orders of the defined types were placed in this encounter.   Patient Instructions  Medication Instructions:  Your physician recommends that you continue on your current medications as directed. Please refer to the Current Medication list given to you today.  *If you need a refill on your cardiac medications before your next appointment, please call your pharmacy*   Lab Work: None If you have labs (blood work) drawn today and your tests are completely normal, you will receive your results only by: Marland Kitchen MyChart Message (if you have MyChart) OR . A paper copy in the mail If you have any lab test that is abnormal or we need to change your treatment, we will call you to review the results.   Testing/Procedures: None   Follow-Up: At [facility], you and your health needs are our priority.  As part of our continuing mission to provide you with exceptional heart care, we have created designated Provider Care Teams.  These Care Teams include your primary Cardiologist (physician) and Advanced Practice Providers (APPs -  Physician Assistants and Nurse Practitioners) who all  work together to provide you with the care you need, when you need it.  We recommend signing up for the patient portal called "MyChart".  Sign up information is provided on this After Visit Summary.  MyChart is used to connect with patients for Virtual Visits (Telemedicine).  Patients are able to view lab/test results, encounter notes, upcoming appointments, etc.  Non-urgent messages can be sent to your provider as well.   To learn more about what you can do with MyChart, go to [email or web address].    Your next appointment:   3 month(s) Keep 6/1 /21 appointment  The format for your next appointment:   In Person  Provider:   Berniece Salines, DO   Other Instructions      Adopting a Healthy Lifestyle.  Know what a healthy weight is for you (roughly BMI <25) and aim to maintain this   Aim for 7+ servings of fruits and vegetables daily   65-80+ fluid ounces of water or unsweet tea for healthy kidneys   Limit to max 1 drink of alcohol per day; avoid smoking/tobacco   Limit animal fats in diet for cholesterol and heart health - choose grass fed whenever available   Avoid highly processed foods, and foods high in saturated/trans fats   Aim for low stress - take time to unwind and care for your mental health   Aim for 150 min of moderate intensity exercise weekly for heart health, and weights twice weekly for bone health   Aim for 7-9 hours of sleep daily   When it comes to diets, agreement about the perfect plan isnt easy to find, even among the experts. Experts at the [employer] developed an idea known as the Healthy Eating Plate. Just imagine a plate divided into logical, healthy portions.   The emphasis is on diet quality:   Load up on vegetables and fruits - one-half of your plate: Aim for color and variety, and remember that potatoes dont count.  Go for whole grains - one-quarter of your plate: Whole wheat, barley, wheat berries, quinoa, oats, brown  rice, and foods made with them. If you want pasta, go with whole wheat pasta.   Protein power - one-quarter of your plate: Fish, chicken, beans, and nuts are all healthy, versatile protein sources. Limit red meat.   The diet, however, does go beyond the plate, offering a few other suggestions.   Use healthy plant oils, such as olive, canola, soy, corn, sunflower and peanut. Check the labels, and avoid partially hydrogenated oil, which have unhealthy trans fats.   If youre thirsty, drink water. Coffee and tea are good in moderation, but skip sugary drinks and limit milk and dairy products to one or two daily servings.   The type of carbohydrate in the diet is more important than the amount. Some sources of carbohydrates, such as vegetables, fruits, whole grains, and beans-are healthier than others.   Finally, stay active  Signed, Berniece Salines, DO  06/30/2019 3:44 PM    [facility]

## 2019-07-02 DIAGNOSIS — E782 Mixed hyperlipidemia: Secondary | ICD-10-CM | POA: Diagnosis not present

## 2019-07-02 DIAGNOSIS — G4700 Insomnia, unspecified: Secondary | ICD-10-CM | POA: Diagnosis not present

## 2019-07-12 ENCOUNTER — Telehealth: Payer: Self-pay | Admitting: *Deleted

## 2019-07-12 NOTE — Telephone Encounter (Signed)
Patient given detailed instructions per Myocardial Perfusion Study Information Sheet for the test on 07/20/2019 at 0815. Patient notified to arrive 15 minutes early and that it is imperative to arrive on time for appointment to keep from having the test rescheduled.  If you need to cancel or reschedule your appointment, please call the office within 24 hours of your appointment. . Patient verbalized understanding.Nancy Chavez, [name] No mychart available

## 2019-07-18 DIAGNOSIS — Z96642 Presence of left artificial hip joint: Secondary | ICD-10-CM | POA: Diagnosis not present

## 2019-07-18 DIAGNOSIS — M1612 Unilateral primary osteoarthritis, left hip: Secondary | ICD-10-CM | POA: Diagnosis not present

## 2019-07-18 DIAGNOSIS — M1611 Unilateral primary osteoarthritis, right hip: Secondary | ICD-10-CM | POA: Diagnosis not present

## 2019-07-18 DIAGNOSIS — M25552 Pain in left hip: Secondary | ICD-10-CM | POA: Diagnosis not present

## 2019-07-20 ENCOUNTER — Ambulatory Visit (INDEPENDENT_AMBULATORY_CARE_PROVIDER_SITE_OTHER): Payer: Medicare HMO

## 2019-07-20 ENCOUNTER — Encounter: Payer: Self-pay | Admitting: Cardiology

## 2019-07-20 ENCOUNTER — Other Ambulatory Visit: Payer: Self-pay

## 2019-07-20 VITALS — Ht 62.0 in | Wt 115.0 lb

## 2019-07-20 DIAGNOSIS — R072 Precordial pain: Secondary | ICD-10-CM | POA: Diagnosis not present

## 2019-07-20 LAB — MYOCARDIAL PERFUSION IMAGING
LV dias vol: 66 mL (ref 46–106)
LV sys vol: 29 mL
Peak HR: 101 {beats}/min
Rest HR: 80 {beats}/min
SDS: 1
SRS: 1
SSS: 2
TID: 1.01

## 2019-07-20 MED ORDER — TECHNETIUM TC 99M TETROFOSMIN IV KIT
10.3000 | PACK | Freq: Once | INTRAVENOUS | Status: AC | PRN
  Administered 2019-07-20: 10.3 via INTRAVENOUS

## 2019-07-20 MED ORDER — TECHNETIUM TC 99M TETROFOSMIN IV KIT
32.1000 | PACK | Freq: Once | INTRAVENOUS | Status: AC | PRN
  Administered 2019-07-20: 32.1 via INTRAVENOUS

## 2019-07-20 MED ORDER — REGADENOSON 0.4 MG/5ML IV SOLN
0.4000 mg | Freq: Once | INTRAVENOUS | Status: AC
  Administered 2019-07-20: 0.4 mg via INTRAVENOUS

## 2019-07-22 DIAGNOSIS — E785 Hyperlipidemia, unspecified: Secondary | ICD-10-CM | POA: Diagnosis not present

## 2019-07-22 DIAGNOSIS — F17210 Nicotine dependence, cigarettes, uncomplicated: Secondary | ICD-10-CM | POA: Diagnosis not present

## 2019-07-22 DIAGNOSIS — E119 Type 2 diabetes mellitus without complications: Secondary | ICD-10-CM | POA: Diagnosis not present

## 2019-07-22 DIAGNOSIS — I10 Essential (primary) hypertension: Secondary | ICD-10-CM | POA: Diagnosis not present

## 2019-07-22 DIAGNOSIS — I6523 Occlusion and stenosis of bilateral carotid arteries: Secondary | ICD-10-CM | POA: Diagnosis not present

## 2019-07-22 DIAGNOSIS — H539 Unspecified visual disturbance: Secondary | ICD-10-CM | POA: Diagnosis not present

## 2019-07-22 DIAGNOSIS — G459 Transient cerebral ischemic attack, unspecified: Secondary | ICD-10-CM | POA: Diagnosis not present

## 2019-07-22 DIAGNOSIS — R4781 Slurred speech: Secondary | ICD-10-CM | POA: Diagnosis not present

## 2019-07-22 DIAGNOSIS — R270 Ataxia, unspecified: Secondary | ICD-10-CM | POA: Diagnosis not present

## 2019-07-22 DIAGNOSIS — R918 Other nonspecific abnormal finding of lung field: Secondary | ICD-10-CM | POA: Diagnosis not present

## 2019-07-22 DIAGNOSIS — Z79899 Other long term (current) drug therapy: Secondary | ICD-10-CM | POA: Diagnosis not present

## 2019-07-22 DIAGNOSIS — R531 Weakness: Secondary | ICD-10-CM | POA: Diagnosis not present

## 2019-07-23 DIAGNOSIS — E119 Type 2 diabetes mellitus without complications: Secondary | ICD-10-CM | POA: Diagnosis not present

## 2019-07-23 DIAGNOSIS — R270 Ataxia, unspecified: Secondary | ICD-10-CM | POA: Diagnosis not present

## 2019-07-23 DIAGNOSIS — R4781 Slurred speech: Secondary | ICD-10-CM | POA: Diagnosis not present

## 2019-07-23 DIAGNOSIS — H539 Unspecified visual disturbance: Secondary | ICD-10-CM | POA: Diagnosis not present

## 2019-07-24 DIAGNOSIS — R270 Ataxia, unspecified: Secondary | ICD-10-CM | POA: Diagnosis not present

## 2019-07-24 DIAGNOSIS — H539 Unspecified visual disturbance: Secondary | ICD-10-CM | POA: Diagnosis not present

## 2019-07-24 DIAGNOSIS — E119 Type 2 diabetes mellitus without complications: Secondary | ICD-10-CM | POA: Diagnosis not present

## 2019-07-24 DIAGNOSIS — H538 Other visual disturbances: Secondary | ICD-10-CM | POA: Diagnosis not present

## 2019-07-24 DIAGNOSIS — R4781 Slurred speech: Secondary | ICD-10-CM | POA: Diagnosis not present

## 2019-07-25 ENCOUNTER — Telehealth: Payer: Self-pay | Admitting: Cardiology

## 2019-07-25 NOTE — Telephone Encounter (Signed)
The echo that she had at [facility] on March 21 should be fine, please cancel any pending echocardiogram schedule in our office.

## 2019-07-25 NOTE — Telephone Encounter (Signed)
Pt had a stroke on Saturday. She was admitted to [facility], and while she was there some imaging was done on her heart. She wanted to have Dr. Harriet Masson look at those results. The patient wanted to know if the tests that [facility] did would be the same as the Echocardiogram that Dr. Harriet Masson wanted her to have.

## 2019-07-25 NOTE — Telephone Encounter (Signed)
Called patient informed her that a repeat echo is not necessary. She verbally understood also gave her stress test results. No further questions.

## 2019-07-26 DIAGNOSIS — Z13820 Encounter for screening for osteoporosis: Secondary | ICD-10-CM | POA: Diagnosis not present

## 2019-07-26 DIAGNOSIS — I6521 Occlusion and stenosis of right carotid artery: Secondary | ICD-10-CM | POA: Diagnosis not present

## 2019-07-26 DIAGNOSIS — Z6823 Body mass index (BMI) 23.0-23.9, adult: Secondary | ICD-10-CM | POA: Diagnosis not present

## 2019-07-26 DIAGNOSIS — R000 Tachycardia, unspecified: Secondary | ICD-10-CM | POA: Diagnosis not present

## 2019-07-26 DIAGNOSIS — Z96642 Presence of left artificial hip joint: Secondary | ICD-10-CM | POA: Diagnosis not present

## 2019-07-26 DIAGNOSIS — E782 Mixed hyperlipidemia: Secondary | ICD-10-CM | POA: Diagnosis not present

## 2019-07-26 DIAGNOSIS — M7072 Other bursitis of hip, left hip: Secondary | ICD-10-CM | POA: Diagnosis not present

## 2019-07-26 DIAGNOSIS — Z780 Asymptomatic menopausal state: Secondary | ICD-10-CM | POA: Diagnosis not present

## 2019-07-26 DIAGNOSIS — M25552 Pain in left hip: Secondary | ICD-10-CM | POA: Diagnosis not present

## 2019-07-26 DIAGNOSIS — Z7689 Persons encountering health services in other specified circumstances: Secondary | ICD-10-CM | POA: Diagnosis not present

## 2019-08-01 DIAGNOSIS — M1611 Unilateral primary osteoarthritis, right hip: Secondary | ICD-10-CM | POA: Diagnosis not present

## 2019-08-01 DIAGNOSIS — M170 Bilateral primary osteoarthritis of knee: Secondary | ICD-10-CM | POA: Diagnosis not present

## 2019-08-01 DIAGNOSIS — Z96641 Presence of right artificial hip joint: Secondary | ICD-10-CM | POA: Diagnosis not present

## 2019-08-01 DIAGNOSIS — M1612 Unilateral primary osteoarthritis, left hip: Secondary | ICD-10-CM | POA: Diagnosis not present

## 2019-08-01 DIAGNOSIS — Z96642 Presence of left artificial hip joint: Secondary | ICD-10-CM | POA: Diagnosis not present

## 2019-08-02 DIAGNOSIS — K219 Gastro-esophageal reflux disease without esophagitis: Secondary | ICD-10-CM | POA: Diagnosis not present

## 2019-08-02 DIAGNOSIS — M5412 Radiculopathy, cervical region: Secondary | ICD-10-CM | POA: Diagnosis not present

## 2019-08-08 ENCOUNTER — Other Ambulatory Visit: Payer: Medicare HMO

## 2019-08-10 DIAGNOSIS — R2689 Other abnormalities of gait and mobility: Secondary | ICD-10-CM | POA: Diagnosis not present

## 2019-08-10 DIAGNOSIS — M542 Cervicalgia: Secondary | ICD-10-CM | POA: Diagnosis not present

## 2019-08-10 DIAGNOSIS — M545 Low back pain: Secondary | ICD-10-CM | POA: Diagnosis not present

## 2019-08-10 DIAGNOSIS — G251 Drug-induced tremor: Secondary | ICD-10-CM | POA: Diagnosis not present

## 2019-08-10 DIAGNOSIS — G4701 Insomnia due to medical condition: Secondary | ICD-10-CM | POA: Diagnosis not present

## 2019-08-11 ENCOUNTER — Other Ambulatory Visit: Payer: Self-pay

## 2019-08-14 ENCOUNTER — Ambulatory Visit: Payer: Medicare HMO | Admitting: Cardiology

## 2019-08-14 ENCOUNTER — Other Ambulatory Visit: Payer: Self-pay

## 2019-08-14 VITALS — BP 126/74 | HR 90 | Ht 62.0 in | Wt 116.0 lb

## 2019-08-14 DIAGNOSIS — I493 Ventricular premature depolarization: Secondary | ICD-10-CM | POA: Diagnosis not present

## 2019-08-14 DIAGNOSIS — I10 Essential (primary) hypertension: Secondary | ICD-10-CM | POA: Diagnosis not present

## 2019-08-14 DIAGNOSIS — I6521 Occlusion and stenosis of right carotid artery: Secondary | ICD-10-CM

## 2019-08-14 DIAGNOSIS — E782 Mixed hyperlipidemia: Secondary | ICD-10-CM

## 2019-08-14 DIAGNOSIS — E1165 Type 2 diabetes mellitus with hyperglycemia: Secondary | ICD-10-CM

## 2019-08-14 NOTE — Progress Notes (Signed)
Cardiology Office Note:    Date:  08/14/2019   ID:  Nancy Chavez, DOB 02/06/1956, MRN [id number]  PCP:  [facility]  Cardiologist:  Berniece Salines, DO  Electrophysiologist:  None   Referring MD: [facility]   Follow-up visit  History of Present Illness:    Nancy Chavez is a 64 y.o. female with a hx of hyperlipidemia, history of TIA, type 2 diabetes, premature family history of coronary artery disease and PVCs.  The patient has been for follow-up visit.  Patient complained of chest pain he did order Lexiscan for the patient which she underwent this testing in March 21 which was reported normal.  Patient has since been notified by her testing result.    Palpitations have started the patient on her Toprol 12.5 mg daily if she has significantly reaction to this.  The medication was stopped immediately.   In the interim the patient was admitted at the [facility] for concern for possible stroke/TIA.  Her symptoms included unsteady gait, slurred speech and change in vision which has been going on 3 days prior to her presentation.   Past Medical History:  Diagnosis Date  . Acquired spondylolisthesis 04/11/2012   Overview:  L4/5  . Anxiety 07/20/2017   Refill given but needs to get in to see Dr Unk Lightning for more refills.  . Asymptomatic microscopic hematuria 12/09/2016  . Backache 04/11/2012  . Benzodiazepine dependence ([place]) 07/12/2017  . Carpal tunnel syndrome of right wrist 09/03/2014  . Cerebral artery occlusion with cerebral infarction ([place]) 08/10/2017   to stop smoking,  d/c coumadin,  begin asa qd  . Chronic back pain   . Chronic radicular cervical pain 08/10/2017   folowed by neurology  . Colitis 08/10/2017   continue meds per ER  . Cyst of thyroid 08/10/2017  . Diverticulosis 08/10/2017  . Drug withdrawal syndrome ([place]) 07/12/2017  . Dysphagia 05/19/2017  . Elevated glucose 08/10/2017  . Epigastric pain 07/29/2017  . Gastroesophageal reflux disease without  esophagitis 08/10/2017  . Hiatal hernia 08/10/2017  . History of colon polyps 08/10/2017  . History of CVA (cerebrovascular accident) 12/24/2014   Formatting of this note might be different from the original. 2006  . Hormone replacement therapy (HRT) 06/17/2017  . Hx-TIA (transient ischemic attack) 12/24/2014  . Hypercalcemia 08/10/2017  . Insomnia 07/20/2017  . Low thyroid stimulating hormone (TSH) level 01/20/2018  . Lumbar radiculopathy, chronic 08/10/2017   Formatting of this note might be different from the original. followed by neurology  . Menopause 08/10/2017   script for prevo  . Mixed dyslipidemia 08/10/2017   at goal,  continue low fat low cholesterol diet.  . Mixed emotional features as adjustment reaction 08/10/2017  . Mixed hyperlipidemia 08/10/2017  . Neck pain 08/10/2017   Prednisone 20mg  2 qd x7d and Lortab 5mg  q4hrs prn #40. If pain persists or worsens may need MRI.  Marland Kitchen Pain in joint, pelvic region and thigh 04/11/2012  . Palpitations 06/21/2019  . Precordial pain 06/21/2019  . Primary osteoarthritis of first carpometacarpal joint of right hand 09/03/2014  . PVC (premature ventricular contraction) 06/21/2019  . Recurrent nephrolithiasis 12/13/2016  . Renal cyst 12/13/2016  . S/P lumbar fusion 05/13/2016  . Shortness of breath 06/21/2019  . Situational mixed anxiety and depressive disorder 08/10/2017  . Spinal stenosis of lumbar region without neurogenic claudication 04/11/2012   Overview:  L4/5  . Stress incontinence, female 08/10/2017  . Thoracic or lumbosacral neuritis or radiculitis 04/11/2012  Overview:  L4-S1 proven by EMG ICD-10 cut over   . Tobacco abuse 08/10/2017   stopped smoking 8 weeks ago-  currently taking chantix  . Transient ischemic attack (TIA)   . Type 2 diabetes mellitus with hyperglycemia, without long-term current use of insulin ([place]) 08/10/2017   A1C at goal,  continue ADA diet  . Weight loss, unintentional 01/20/2018    Past Surgical History:  Procedure Laterality Date  .  ABDOMINAL HYSTERECTOMY    . BACK SURGERY    . CHOLECYSTECTOMY    . FOOT SURGERY    . HIP SURGERY Bilateral    Hip replacement  . TONSILLECTOMY      Current Medications: Current Meds  Medication Sig  . atorvastatin (LIPITOR) 80 MG tablet Take 40 mg by mouth daily.  Marland Kitchen buPROPion (WELLBUTRIN SR) 100 MG 12 hr tablet Take 100 mg by mouth at bedtime.   . clonazePAM (KLONOPIN) 1 MG tablet Take 1 mg by mouth every evening.  . diclofenac Sodium (VOLTAREN) 1 % GEL   . gabapentin (NEURONTIN) 600 MG tablet TAKE ONE TABLET BY MOUTH EVERY MORNING and TAKE TWO TABLETS BY MOUTH AT BEDTIME  . ibuprofen (ADVIL,MOTRIN) 200 MG tablet Take 800 mg by mouth every 6 (six) hours as needed for moderate pain.  . Multiple Vitamin (MULTIVITAMIN WITH MINERALS) TABS tablet Take 1 tablet by mouth daily.  Marland Kitchen neomycin-polymyxin b-dexamethasone (MAXITROL) 3.5-10000-0.1 OINT   . nitroGLYCERIN (NITROSTAT) 0.4 MG SL tablet Place 1 tablet (0.4 mg total) under the tongue every 5 (five) minutes as needed.  . primidone (MYSOLINE) 50 MG tablet Take 50 mg by mouth 2 (two) times daily.   . traMADol (ULTRAM) 50 MG tablet Take 50 mg by mouth 4 (four) times daily as needed.  . traZODone (DESYREL) 100 MG tablet 100 mg at bedtime as needed.  . [DISCONTINUED] cyclobenzaprine (FLEXERIL) 10 MG tablet Take 10 mg by mouth 3 (three) times daily as needed for muscle spasms.     Allergies:   Oxycodone and Metoprolol succinate [metoprolol]   Social History   Socioeconomic History  . Marital status: Married    Spouse name: Not on file  . Number of children: Not on file  . Years of education: Not on file  . Highest education level: Not on file  Occupational History  . Not on file  Tobacco Use  . Smoking status: Current Every Day Smoker    Types: Cigarettes  . Smokeless tobacco: Never Used  Substance and Sexual Activity  . Alcohol use: No  . Drug use: Not Currently    Types: Marijuana  . Sexual activity: Not on file  Other Topics  Concern  . Not on file  Social History Narrative  . Not on file   Social Determinants of Health   Financial Resource Strain:   . Difficulty of Paying Living Expenses:   Food Insecurity:   . Worried About Charity fundraiser in the Last Year:   . Arboriculturist in the Last Year:   Transportation Needs:   . Film/video editor (Medical):   Marland Kitchen Lack of Transportation (Non-Medical):   Physical Activity:   . Days of Exercise per Week:   . Minutes of Exercise per Session:   Stress:   . Feeling of Stress :   Social Connections:   . Frequency of Communication with Friends and Family:   . Frequency of Social Gatherings with Friends and Family:   . Attends Religious Services:   . Active  Member of Clubs or Organizations:   . Attends Archivist Meetings:   Marland Kitchen Marital Status:      Family History: The patient's family history includes Cancer in her brother, brother, and sister; Diabetes in her brother; Heart failure in her mother.  ROS:   Review of Systems  Constitution: Negative for decreased appetite, fever and weight gain.  HENT: Negative for congestion, ear discharge, hoarse voice and sore throat.   Eyes: Negative for discharge, redness, vision loss in right eye and visual halos.  Cardiovascular: Negative for chest pain, dyspnea on exertion, leg swelling, orthopnea and palpitations.  Respiratory: Negative for cough, hemoptysis, shortness of breath and snoring.   Endocrine: Negative for heat intolerance and polyphagia.  Hematologic/Lymphatic: Negative for bleeding problem. Does not bruise/bleed easily.  Skin: Negative for flushing, nail changes, rash and suspicious lesions.  Musculoskeletal: Negative for arthritis, joint pain, muscle cramps, myalgias, neck pain and stiffness.  Gastrointestinal: Negative for abdominal pain, bowel incontinence, diarrhea and excessive appetite.  Genitourinary: Negative for decreased libido, genital sores and incomplete emptying.   Neurological: Negative for brief paralysis, focal weakness, headaches and loss of balance.  Psychiatric/Behavioral: Negative for altered mental status, depression and suicidal ideas.  Allergic/Immunologic: Negative for HIV exposure and persistent infections.    EKGs/Labs/Other Studies Reviewed:    The following studies were reviewed today:   EKG: None today.  MRI: 22nd 2020 showed intermittent motion degradation.  No evidence of acute intracranial abnormalities including infarction.  Redemonstration of chronic right parietal lobe cortically based infarct.  Grossly unchanged right cerebellar medullary angle arachnoid cyst.  Transthoracic echocardiogram done on July 22, 2019 showed normal left ventricular chamber size, normal left ventricle ejection fraction 60 to 65%.  Diastolic filling pattern indicated impaired relaxation.  Right ventricle was normal.  Left atrium is normal in size.  Right atrium is normal size.  Mild aortic valve sclerosis.  No mitral regurgitation.  Tricuspid valve appears structurally normal.  Pulmonic valve is normal.  Aortic root, ascending aorta and aortic arch are appear to be normal.  CTA neck on July 22, 2019 shows no acute intracranial abnormalities.  No large vessel occlusion, hemodynamically significant stenosis or evidence of dissection.  Plaque at the right ICA origin causes less and 50% stenosis.  Recent Labs: Labs from July 23, 2019: [facility] CBC: WBC 9.0, hemoglobin 13.3, hematocrit 37.3, platelet 331 Chemistry: Sodium 135, potassium 3.8, chloride 100, bicarb 27, anion gap 12, BUN 11, creatinine 0.6, glucose 171, calcium 9.1, TSH 0.53, vitamin B12 713  Recent Lipid Panel Triglyceride 107, total cholesterol 172, LDL 105, HDL 45  Physical Exam:    VS:  BP 126/74   Pulse 90   Ht 5\' 2"  (1.575 m)   Wt 116 lb (52.6 kg)   BMI 21.22 kg/m     Wt Readings from Last 3 Encounters:  08/14/19 116 lb (52.6 kg)  07/20/19 115 lb (52.2 kg)   06/30/19 118 lb (53.5 kg)     GEN: Well nourished, well developed in no acute distress HEENT: Normal NECK: No JVD; No carotid bruits LYMPHATICS: No lymphadenopathy CARDIAC: S1S2 noted,RRR, no murmurs, rubs, gallops RESPIRATORY:  Clear to auscultation without rales, wheezing or rhonchi  ABDOMEN: Soft, non-tender, non-distended, +bowel sounds, no guarding. EXTREMITIES: No edema, No cyanosis, no clubbing MUSCULOSKELETAL:  No deformity  SKIN: Warm and dry NEUROLOGIC:  Alert and oriented x 3, non-focal PSYCHIATRIC:  Normal affect, good insight  ASSESSMENT:    1. Stenosis of right carotid artery  2. PVC (premature ventricular contraction)   3. Type 2 diabetes mellitus with hyperglycemia, without long-term current use of insulin ([place])   4. Essential hypertension   5. Mixed hyperlipidemia    PLAN:     1.  Recent hospitalization back in March concerning for TIA.  However she did see neurology few days ago who reported that the patient may not have had a TIA and is concerned more for seizure episodes.  She is planning an upcoming EEG with her neurologist.  For now continue patient atorvastatin.   2.  PVCs-she does have intermittent palpitations but she tells me that there is very rare and prefers to hold off on any additional medication.  Also able to review her report of her thyroid monitor from her PCP which did not show any atrial arrhythmia or significant PVCs.  3.  Diabetes type 2-continue management per primary doctor.  4.  Hyperlipidemia continue atorvastatin.  The patient is in agreement with the above plan. The patient left the office in stable condition.  The patient will follow up in 1 year or sooner if needed.  Medication Adjustments/Labs and Tests Ordered: Current medicines are reviewed at length with the patient today.  Concerns regarding medicines are outlined above.  No orders of the defined types were placed in this encounter.  No orders of the defined types were  placed in this encounter.   Patient Instructions  Medication Instructions:   Your physician recommends that you continue on your current medications as directed. Please refer to the Current Medication list given to you today.   *If you need a refill on your cardiac medications before your next appointment, please call your pharmacy*   Lab Work: [place]   If you have labs (blood work) drawn today and your tests are completely normal, you will receive your results only by: Marland Kitchen MyChart Message (if you have MyChart) OR . A paper copy in the mail If you have any lab test that is abnormal or we need to change your treatment, we will call you to review the results.   Testing/Procedures: NONE ORDERED  TODAY    Follow-Up: At [facility], you and your health needs are our priority.  As part of our continuing mission to provide you with exceptional heart care, we have created designated Provider Care Teams.  These Care Teams include your primary Cardiologist (physician) and Advanced Practice Providers (APPs -  Physician Assistants and Nurse Practitioners) who all work together to provide you with the care you need, when you need it.  We recommend signing up for the patient portal called "MyChart".  Sign up information is provided on this After Visit Summary.  MyChart is used to connect with patients for Virtual Visits (Telemedicine).  Patients are able to view lab/test results, encounter notes, upcoming appointments, etc.  Non-urgent messages can be sent to your provider as well.   To learn more about what you can do with MyChart, go to [email or web address].    Your next appointment:   1 year(s)  The format for your next appointment:   In Person  Provider:   You will see Berniece Salines, DO.  Or, you can be scheduled with the following Advanced Practice Provider on your designated Care Team (at our [facility]):  Laurann Montana, FNP     Other Instructions      Adopting a Healthy Lifestyle.  Know what a healthy weight is for you (roughly BMI <25) and aim to maintain  this   Aim for 7+ servings of fruits and vegetables daily   65-80+ fluid ounces of water or unsweet tea for healthy kidneys   Limit to max 1 drink of alcohol per day; avoid smoking/tobacco   Limit animal fats in diet for cholesterol and heart health - choose grass fed whenever available   Avoid highly processed foods, and foods high in saturated/trans fats   Aim for low stress - take time to unwind and care for your mental health   Aim for 150 min of moderate intensity exercise weekly for heart health, and weights twice weekly for bone health   Aim for 7-9 hours of sleep daily   When it comes to diets, agreement about the perfect plan isnt easy to find, even among the experts. Experts at the [employer] developed an idea known as the Healthy Eating Plate. Just imagine a plate divided into logical, healthy portions.   The emphasis is on diet quality:   Load up on vegetables and fruits - one-half of your plate: Aim for color and variety, and remember that potatoes dont count.   Go for whole grains - one-quarter of your plate: Whole wheat, barley, wheat berries, quinoa, oats, brown rice, and foods made with them. If you want pasta, go with whole wheat pasta.   Protein power - one-quarter of your plate: Fish, chicken, beans, and nuts are all healthy, versatile protein sources. Limit red meat.   The diet, however, does go beyond the plate, offering a few other suggestions.   Use healthy plant oils, such as olive, canola, soy, corn, sunflower and peanut. Check the labels, and avoid partially hydrogenated oil, which have unhealthy trans fats.   If youre thirsty, drink water. Coffee and tea are good in moderation, but skip sugary drinks and limit milk and dairy products to one or two daily servings.   The type of carbohydrate in the diet is more important  than the amount. Some sources of carbohydrates, such as vegetables, fruits, whole grains, and beans-are healthier than others.   Finally, stay active  Signed, Berniece Salines, DO  08/14/2019 2:00 PM    [place]

## 2019-08-14 NOTE — Patient Instructions (Signed)
Medication Instructions:   Your physician recommends that you continue on your current medications as directed. Please refer to the Current Medication list given to you today.   *If you need a refill on your cardiac medications before your next appointment, please call your pharmacy*   Lab Work: [place]   If you have labs (blood work) drawn today and your tests are completely normal, you will receive your results only by: Marland Kitchen MyChart Message (if you have MyChart) OR . A paper copy in the mail If you have any lab test that is abnormal or we need to change your treatment, we will call you to review the results.   Testing/Procedures: NONE ORDERED  TODAY    Follow-Up: At [facility], you and your health needs are our priority.  As part of our continuing mission to provide you with exceptional heart care, we have created designated Provider Care Teams.  These Care Teams include your primary Cardiologist (physician) and Advanced Practice Providers (APPs -  Physician Assistants and Nurse Practitioners) who all work together to provide you with the care you need, when you need it.  We recommend signing up for the patient portal called "MyChart".  Sign up information is provided on this After Visit Summary.  MyChart is used to connect with patients for Virtual Visits (Telemedicine).  Patients are able to view lab/test results, encounter notes, upcoming appointments, etc.  Non-urgent messages can be sent to your provider as well.   To learn more about what you can do with MyChart, go to [email or web address].    Your next appointment:   1 year(s)  The format for your next appointment:   In Person  Provider:   You will see Berniece Salines, DO.  Or, you can be scheduled with the following Advanced Practice Provider on your designated Care Team (at our [facility]):  Laurann Montana, FNP     Other Instructions

## 2019-08-29 ENCOUNTER — Encounter: Payer: Medicare HMO | Admitting: Vascular Surgery

## 2019-09-01 DIAGNOSIS — K219 Gastro-esophageal reflux disease without esophagitis: Secondary | ICD-10-CM | POA: Diagnosis not present

## 2019-09-01 DIAGNOSIS — E782 Mixed hyperlipidemia: Secondary | ICD-10-CM | POA: Diagnosis not present

## 2019-09-12 DIAGNOSIS — R569 Unspecified convulsions: Secondary | ICD-10-CM | POA: Diagnosis not present

## 2019-10-02 DIAGNOSIS — E785 Hyperlipidemia, unspecified: Secondary | ICD-10-CM | POA: Diagnosis not present

## 2019-10-02 DIAGNOSIS — K219 Gastro-esophageal reflux disease without esophagitis: Secondary | ICD-10-CM | POA: Diagnosis not present

## 2019-10-02 DIAGNOSIS — M810 Age-related osteoporosis without current pathological fracture: Secondary | ICD-10-CM | POA: Diagnosis not present

## 2019-10-03 ENCOUNTER — Encounter: Payer: Medicare HMO | Admitting: Vascular Surgery

## 2019-10-03 ENCOUNTER — Ambulatory Visit: Payer: Medicare HMO | Admitting: Cardiology

## 2019-11-01 DIAGNOSIS — M810 Age-related osteoporosis without current pathological fracture: Secondary | ICD-10-CM | POA: Diagnosis not present

## 2019-11-01 DIAGNOSIS — E785 Hyperlipidemia, unspecified: Secondary | ICD-10-CM | POA: Diagnosis not present

## 2019-11-01 DIAGNOSIS — K219 Gastro-esophageal reflux disease without esophagitis: Secondary | ICD-10-CM | POA: Diagnosis not present

## 2019-11-02 ENCOUNTER — Encounter: Payer: Medicare HMO | Admitting: Vascular Surgery

## 2019-11-17 DIAGNOSIS — H0014 Chalazion left upper eyelid: Secondary | ICD-10-CM | POA: Diagnosis not present

## 2019-11-17 DIAGNOSIS — H00014 Hordeolum externum left upper eyelid: Secondary | ICD-10-CM | POA: Diagnosis not present

## 2019-11-17 DIAGNOSIS — H00021 Hordeolum internum right upper eyelid: Secondary | ICD-10-CM | POA: Diagnosis not present

## 2019-11-22 ENCOUNTER — Encounter: Payer: Self-pay | Admitting: Vascular Surgery

## 2019-11-22 ENCOUNTER — Ambulatory Visit (INDEPENDENT_AMBULATORY_CARE_PROVIDER_SITE_OTHER): Payer: Medicare HMO | Admitting: Vascular Surgery

## 2019-11-22 ENCOUNTER — Other Ambulatory Visit: Payer: Self-pay

## 2019-11-22 VITALS — BP 110/70 | HR 76 | Temp 97.9°F | Resp 20 | Ht 62.0 in | Wt 119.0 lb

## 2019-11-22 DIAGNOSIS — I6521 Occlusion and stenosis of right carotid artery: Secondary | ICD-10-CM

## 2019-11-22 NOTE — Progress Notes (Signed)
REASON FOR CONSULT:    Right carotid stenosis.  The consult is requested by Dr. Serita Grammes.   ASSESSMENT & PLAN:   MILD RIGHT CAROTID STENOSIS: This patient has a mild less than 50% right carotid stenosis by CT angiogram.  She has had no further symptoms and on her work-up in March there was no evidence of acute stroke on her MRI or CT scan.  I think it would be reasonable repeat her duplex 6 months from her last duplex which would be in September of this year.  We have discussed the importance of trying to get off of cigarettes completely.  We also discussed the importance of exercise and nutrition.  Given her limitations related to her back and hips I suggested potentially water aerobics.  She understands we would only consider carotid endarterectomy if she developed clear-cut right hemispheric symptoms or the stenosis progressed to greater than 80%.  I will see her back in 9 months.  She knows to call sooner if she has problems.  Deitra Mayo, MD Office: [phone]   HPI:   Nancy Chavez is a pleasant 64 y.o. female, who was referred with a right carotid stenosis.  I have reviewed the records from the referring office.  The patient was seen in the emergency department on 07/22/2019 with slurred speech and ataxia.  The patient was discharged on 07/24/2019.  Of note an MRI was done that admission on 07/24/2019 but there was some motion artifact.  There is no acute intracranial abnormality noted.  There was a chronic right parietal lobe infarct.  CT of the head also showed no acute intracranial event.  A CT angiogram of the neck showed no large vessel acute occlusion or dissection.  There was a less than 50% stenosis of the right internal carotid artery.  On my history the patient had an episode of slurred speech on 07/22/2019 which lasted briefly.  She also describes some mild dizziness.  She had no focal weakness or paresthesias.  She had no amaurosis fugax.  She said she had previously  had a TIA 20 years ago which was associated with some slurred speech.  She was placed on aspirin in March and is also been on a statin.  She is had no further symptoms.  Her risk factors for peripheral vascular disease hypercholesterolemia and occasional tobacco use.  She denies any history of diabetes, hypertension, or family history of premature cardiovascular disease.  She does have some chronic issues with her back and has had hip replacements.  Past Medical History:  Diagnosis Date  . Acquired spondylolisthesis 04/11/2012   Overview:  L4/5  . Anxiety 07/20/2017   Refill given but needs to get in to see Dr Unk Lightning for more refills.  . Asymptomatic microscopic hematuria 12/09/2016  . Backache 04/11/2012  . Benzodiazepine dependence ([place]) 07/12/2017  . Carpal tunnel syndrome of right wrist 09/03/2014  . Cerebral artery occlusion with cerebral infarction ([place]) 08/10/2017   to stop smoking,  d/c coumadin,  begin asa qd  . Chronic back pain   . Chronic radicular cervical pain 08/10/2017   folowed by neurology  . Colitis 08/10/2017   continue meds per ER  . Cyst of thyroid 08/10/2017  . Diverticulosis 08/10/2017  . Drug withdrawal syndrome ([place]) 07/12/2017  . Dysphagia 05/19/2017  . Elevated glucose 08/10/2017  . Epigastric pain 07/29/2017  . Gastroesophageal reflux disease without esophagitis 08/10/2017  . Hiatal hernia 08/10/2017  . History of colon polyps 08/10/2017  . History  of CVA (cerebrovascular accident) 12/24/2014   Formatting of this note might be different from the original. 2006  . Hormone replacement therapy (HRT) 06/17/2017  . Hx-TIA (transient ischemic attack) 12/24/2014  . Hypercalcemia 08/10/2017  . Insomnia 07/20/2017  . Low thyroid stimulating hormone (TSH) level 01/20/2018  . Lumbar radiculopathy, chronic 08/10/2017   Formatting of this note might be different from the original. followed by neurology  . Menopause 08/10/2017   script for prevo  . Mixed dyslipidemia 08/10/2017   at goal,  continue  low fat low cholesterol diet.  . Mixed emotional features as adjustment reaction 08/10/2017  . Mixed hyperlipidemia 08/10/2017  . Neck pain 08/10/2017   Prednisone 20mg  2 qd x7d and Lortab 5mg  q4hrs prn #40. If pain persists or worsens may need MRI.  Marland Kitchen Pain in joint, pelvic region and thigh 04/11/2012  . Palpitations 06/21/2019  . Precordial pain 06/21/2019  . Primary osteoarthritis of first carpometacarpal joint of right hand 09/03/2014  . PVC (premature ventricular contraction) 06/21/2019  . Recurrent nephrolithiasis 12/13/2016  . Renal cyst 12/13/2016  . S/P lumbar fusion 05/13/2016  . Shortness of breath 06/21/2019  . Situational mixed anxiety and depressive disorder 08/10/2017  . Spinal stenosis of lumbar region without neurogenic claudication 04/11/2012   Overview:  L4/5  . Stress incontinence, female 08/10/2017  . Thoracic or lumbosacral neuritis or radiculitis 04/11/2012   Overview:  L4-S1 proven by EMG ICD-10 cut over   . Tobacco abuse 08/10/2017   stopped smoking 8 weeks ago-  currently taking chantix  . Transient ischemic attack (TIA)   . Type 2 diabetes mellitus with hyperglycemia, without long-term current use of insulin ([place]) 08/10/2017   A1C at goal,  continue ADA diet  . Weight loss, unintentional 01/20/2018    Family History  Problem Relation Age of Onset  . Heart failure Mother   . Cancer Sister   . Diabetes Brother   . Cancer Brother   . Cancer Brother     SOCIAL HISTORY: Social History   Socioeconomic History  . Marital status: Married    Spouse name: Not on file  . Number of children: Not on file  . Years of education: Not on file  . Highest education level: Not on file  Occupational History  . Not on file  Tobacco Use  . Smoking status: Current Some Day Smoker    Types: Cigarettes  . Smokeless tobacco: Never Used  Vaping Use  . Vaping Use: Never used  Substance and Sexual Activity  . Alcohol use: No  . Drug use: Not Currently    Types: Marijuana  . Sexual  activity: Not on file  Other Topics Concern  . Not on file  Social History Narrative  . Not on file   Social Determinants of Health   Financial Resource Strain:   . Difficulty of Paying Living Expenses:   Food Insecurity:   . Worried About Charity fundraiser in the Last Year:   . Arboriculturist in the Last Year:   Transportation Needs:   . Film/video editor (Medical):   Marland Kitchen Lack of Transportation (Non-Medical):   Physical Activity:   . Days of Exercise per Week:   . Minutes of Exercise per Session:   Stress:   . Feeling of Stress :   Social Connections:   . Frequency of Communication with Friends and Family:   . Frequency of Social Gatherings with Friends and Family:   . Attends Religious Services:   .  Active Member of Clubs or Organizations:   . Attends Archivist Meetings:   Marland Kitchen Marital Status:   Intimate Partner Violence:   . Fear of Current or Ex-Partner:   . Emotionally Abused:   Marland Kitchen Physically Abused:   . Sexually Abused:     Allergies  Allergen Reactions  . Oxycodone Nausea And Vomiting  . Metoprolol Succinate [Metoprolol] Rash    Raised, red whelps    Current Outpatient Medications  Medication Sig Dispense Refill  . atorvastatin (LIPITOR) 80 MG tablet Take 40 mg by mouth daily.    Marland Kitchen buPROPion (WELLBUTRIN SR) 100 MG 12 hr tablet Take 100 mg by mouth at bedtime.     . cephALEXin (KEFLEX) 500 MG capsule     . clonazePAM (KLONOPIN) 1 MG tablet Take 1 mg by mouth every evening.    . diclofenac Sodium (VOLTAREN) 1 % GEL     . gabapentin (NEURONTIN) 600 MG tablet TAKE ONE TABLET BY MOUTH EVERY MORNING and TAKE TWO TABLETS BY MOUTH AT BEDTIME    . ibuprofen (ADVIL,MOTRIN) 200 MG tablet Take 800 mg by mouth every 6 (six) hours as needed for moderate pain.    . Multiple Vitamin (MULTIVITAMIN WITH MINERALS) TABS tablet Take 1 tablet by mouth daily.    Marland Kitchen neomycin-polymyxin b-dexamethasone (MAXITROL) 3.5-10000-0.1 OINT     . primidone (MYSOLINE) 50 MG tablet  Take 50 mg by mouth 2 (two) times daily.     . traMADol (ULTRAM) 50 MG tablet Take 50 mg by mouth 4 (four) times daily as needed.    . traZODone (DESYREL) 100 MG tablet 100 mg at bedtime as needed.    . nitroGLYCERIN (NITROSTAT) 0.4 MG SL tablet Place 1 tablet (0.4 mg total) under the tongue every 5 (five) minutes as needed. 30 tablet 3   No current facility-administered medications for this visit.    REVIEW OF SYSTEMS:  [X]  denotes positive finding, [ ]  denotes negative finding Cardiac  Comments:  Chest pain or chest pressure:    Shortness of breath upon exertion:    Short of breath when lying flat:    Irregular heart rhythm:        Vascular    Pain in calf, thigh, or hip brought on by ambulation: x   Pain in feet at night that wakes you up from your sleep:     Blood clot in your veins:    Leg swelling:         Pulmonary    Oxygen at home:    Productive cough:     Wheezing:         Neurologic    Sudden weakness in arms or legs:     Sudden numbness in arms or legs:     Sudden onset of difficulty speaking or slurred speech:    Temporary loss of vision in one eye:     Problems with dizziness:         Gastrointestinal    Blood in stool:     Vomited blood:         Genitourinary    Burning when urinating:     Blood in urine:        Psychiatric    Major depression:         Hematologic    Bleeding problems:    Problems with blood clotting too easily:        Skin    Rashes or ulcers:  Constitutional    Fever or chills:     PHYSICAL EXAM:   Vitals:   11/22/19 0927 11/22/19 0929  BP: 117/72 110/70  Pulse: 76   Resp: 20   Temp: 97.9 F (36.6 C)   SpO2: 95%   Weight: 54 kg   Height: 5\' 2"  (1.575 m)     GENERAL: The patient is a well-nourished female, in no acute distress. The vital signs are documented above. CARDIAC: There is a regular rate and rhythm.  VASCULAR: I do not detect carotid bruits. He has palpable femoral, popliteal, dorsalis pedis,  posterior tibial pulses bilaterally. She has no significant lower extremity swelling. PULMONARY: There is good air exchange bilaterally without wheezing or rales. ABDOMEN: Soft and non-tender with normal pitched bowel sounds.  MUSCULOSKELETAL: There are no major deformities or cyanosis. NEUROLOGIC: No focal weakness or paresthesias are detected. SKIN: There are no ulcers or rashes noted. PSYCHIATRIC: The patient has a normal affect.  DATA:    I reviewed her CT angiogram of the neck which showed a less than 50% right carotid stenosis.  MRI in March showed no evidence of an acute intracranial event.  Likewise her CT of the head was unremarkable.  Great EKG during admission was unremarkable.

## 2019-11-27 ENCOUNTER — Other Ambulatory Visit: Payer: Self-pay | Admitting: *Deleted

## 2019-11-27 DIAGNOSIS — I6521 Occlusion and stenosis of right carotid artery: Secondary | ICD-10-CM

## 2019-12-02 DIAGNOSIS — M810 Age-related osteoporosis without current pathological fracture: Secondary | ICD-10-CM | POA: Diagnosis not present

## 2019-12-02 DIAGNOSIS — E785 Hyperlipidemia, unspecified: Secondary | ICD-10-CM | POA: Diagnosis not present

## 2019-12-02 DIAGNOSIS — K219 Gastro-esophageal reflux disease without esophagitis: Secondary | ICD-10-CM | POA: Diagnosis not present

## 2019-12-14 DIAGNOSIS — Z79899 Other long term (current) drug therapy: Secondary | ICD-10-CM | POA: Diagnosis not present

## 2019-12-14 DIAGNOSIS — R251 Tremor, unspecified: Secondary | ICD-10-CM | POA: Diagnosis not present

## 2019-12-14 DIAGNOSIS — M542 Cervicalgia: Secondary | ICD-10-CM | POA: Diagnosis not present

## 2019-12-14 DIAGNOSIS — M545 Low back pain: Secondary | ICD-10-CM | POA: Diagnosis not present

## 2019-12-14 DIAGNOSIS — R252 Cramp and spasm: Secondary | ICD-10-CM | POA: Diagnosis not present

## 2019-12-14 DIAGNOSIS — R42 Dizziness and giddiness: Secondary | ICD-10-CM | POA: Diagnosis not present

## 2020-01-02 DIAGNOSIS — M810 Age-related osteoporosis without current pathological fracture: Secondary | ICD-10-CM | POA: Diagnosis not present

## 2020-01-02 DIAGNOSIS — K219 Gastro-esophageal reflux disease without esophagitis: Secondary | ICD-10-CM | POA: Diagnosis not present

## 2020-01-02 DIAGNOSIS — E785 Hyperlipidemia, unspecified: Secondary | ICD-10-CM | POA: Diagnosis not present

## 2020-01-10 DIAGNOSIS — H0014 Chalazion left upper eyelid: Secondary | ICD-10-CM | POA: Diagnosis not present

## 2020-01-24 ENCOUNTER — Encounter: Payer: Self-pay | Admitting: Vascular Surgery

## 2020-01-24 ENCOUNTER — Ambulatory Visit: Payer: Medicare HMO | Admitting: Vascular Surgery

## 2020-01-24 ENCOUNTER — Other Ambulatory Visit: Payer: Self-pay

## 2020-01-24 ENCOUNTER — Ambulatory Visit (HOSPITAL_COMMUNITY)
Admission: RE | Admit: 2020-01-24 | Discharge: 2020-01-24 | Disposition: A | Discharge status: Home / Self Care | Payer: Medicare HMO | Source: Ambulatory Visit | Attending: Vascular Surgery | Admitting: Vascular Surgery

## 2020-01-24 ENCOUNTER — Ambulatory Visit (INDEPENDENT_AMBULATORY_CARE_PROVIDER_SITE_OTHER): Payer: Medicare HMO | Admitting: Vascular Surgery

## 2020-01-24 VITALS — BP 126/72 | HR 93 | Temp 98.1°F | Resp 20 | Ht 62.0 in | Wt 121.0 lb

## 2020-01-24 DIAGNOSIS — I6521 Occlusion and stenosis of right carotid artery: Secondary | ICD-10-CM | POA: Insufficient documentation

## 2020-01-24 DIAGNOSIS — H0014 Chalazion left upper eyelid: Secondary | ICD-10-CM | POA: Diagnosis not present

## 2020-01-24 NOTE — Progress Notes (Signed)
REASON FOR VISIT:   Follow-up of mild right carotid stenosis  MEDICAL ISSUES:   MILD RIGHT CAROTID STENOSIS: Follow-up duplex scan today shows no evidence of carotid disease bilaterally.  She is asymptomatic.  She is on aspirin and is on a statin.  I encouraged her to stay as active as possible.  We also discussed the importance of nutrition.  We again discussed the importance of tobacco cessation.  I ordered a follow-up carotid duplex scan in 2 years and I will see her back at that time.  She knows to call sooner if she has problems.   HPI:   Nancy Chavez is a pleasant 64 y.o. female who I saw in consultation on 11/22/2019 with a mild right carotid stenosis.  The patient was seen in the emergency department on 07/22/2019 with slurred speech and ataxia.  His CT angiogram of the neck was performed at that time which showed a less than 50% right internal carotid artery stenosis.  Given that the patient had a mild less than 50% right carotid stenosis with no further symptoms since March I felt it would be reasonable to repeat her duplex scan in 6 months from her previous study.  She comes in for a follow-up study.  At that time we did discuss the importance of exercise and nutrition.  Since I saw her last, she denies any history of stroke, TIAs, expressive or receptive aphasia, or amaurosis fugax.  She is on aspirin and is on a statin.  She smokes a quarter of a pack per day of cigarettes.  She denies any history of claudication, rest pain, or nonhealing ulcers.  Past Medical History:  Diagnosis Date  . Acquired spondylolisthesis 04/11/2012   Overview:  L4/5  . Anxiety 07/20/2017   Refill given but needs to get in to see Dr Unk Lightning for more refills.  . Asymptomatic microscopic hematuria 12/09/2016  . Backache 04/11/2012  . Benzodiazepine dependence ([place]) 07/12/2017  . Carpal tunnel syndrome of right wrist 09/03/2014  . Cerebral artery occlusion with cerebral infarction ([place]) 08/10/2017   to  stop smoking,  d/c coumadin,  begin asa qd  . Chronic back pain   . Chronic radicular cervical pain 08/10/2017   folowed by neurology  . Colitis 08/10/2017   continue meds per ER  . Cyst of thyroid 08/10/2017  . Diverticulosis 08/10/2017  . Drug withdrawal syndrome ([place]) 07/12/2017  . Dysphagia 05/19/2017  . Elevated glucose 08/10/2017  . Epigastric pain 07/29/2017  . Gastroesophageal reflux disease without esophagitis 08/10/2017  . Hiatal hernia 08/10/2017  . History of colon polyps 08/10/2017  . History of CVA (cerebrovascular accident) 12/24/2014   Formatting of this note might be different from the original. 2006  . Hormone replacement therapy (HRT) 06/17/2017  . Hx-TIA (transient ischemic attack) 12/24/2014  . Hypercalcemia 08/10/2017  . Insomnia 07/20/2017  . Low thyroid stimulating hormone (TSH) level 01/20/2018  . Lumbar radiculopathy, chronic 08/10/2017   Formatting of this note might be different from the original. followed by neurology  . Menopause 08/10/2017   script for prevo  . Mixed dyslipidemia 08/10/2017   at goal,  continue low fat low cholesterol diet.  . Mixed emotional features as adjustment reaction 08/10/2017  . Mixed hyperlipidemia 08/10/2017  . Neck pain 08/10/2017   Prednisone 20mg  2 qd x7d and Lortab 5mg  q4hrs prn #40. If pain persists or worsens may need MRI.  Marland Kitchen Pain in joint, pelvic region and thigh 04/11/2012  . Palpitations 06/21/2019  .  Precordial pain 06/21/2019  . Primary osteoarthritis of first carpometacarpal joint of right hand 09/03/2014  . PVC (premature ventricular contraction) 06/21/2019  . Recurrent nephrolithiasis 12/13/2016  . Renal cyst 12/13/2016  . S/P lumbar fusion 05/13/2016  . Shortness of breath 06/21/2019  . Situational mixed anxiety and depressive disorder 08/10/2017  . Spinal stenosis of lumbar region without neurogenic claudication 04/11/2012   Overview:  L4/5  . Stress incontinence, female 08/10/2017  . Thoracic or lumbosacral neuritis or radiculitis 04/11/2012    Overview:  L4-S1 proven by EMG ICD-10 cut over   . Tobacco abuse 08/10/2017   stopped smoking 8 weeks ago-  currently taking chantix  . Transient ischemic attack (TIA)   . Type 2 diabetes mellitus with hyperglycemia, without long-term current use of insulin ([place]) 08/10/2017   A1C at goal,  continue ADA diet  . Weight loss, unintentional 01/20/2018    Family History  Problem Relation Age of Onset  . Heart failure Mother   . Cancer Sister   . Diabetes Brother   . Cancer Brother   . Cancer Brother     SOCIAL HISTORY: Social History   Tobacco Use  . Smoking status: Current Some Day Smoker    Types: Cigarettes  . Smokeless tobacco: Never Used  Substance Use Topics  . Alcohol use: No    Allergies  Allergen Reactions  . Oxycodone Nausea And Vomiting  . Metoprolol Succinate [Metoprolol] Rash    Raised, red whelps    Current Outpatient Medications  Medication Sig Dispense Refill  . atorvastatin (LIPITOR) 80 MG tablet Take 40 mg by mouth daily.    Marland Kitchen buPROPion (WELLBUTRIN SR) 100 MG 12 hr tablet Take 100 mg by mouth at bedtime.     . cephALEXin (KEFLEX) 500 MG capsule     . clonazePAM (KLONOPIN) 1 MG tablet Take 1 mg by mouth every evening.    . diclofenac Sodium (VOLTAREN) 1 % GEL     . gabapentin (NEURONTIN) 600 MG tablet TAKE ONE TABLET BY MOUTH EVERY MORNING and TAKE TWO TABLETS BY MOUTH AT BEDTIME    . ibuprofen (ADVIL,MOTRIN) 200 MG tablet Take 800 mg by mouth every 6 (six) hours as needed for moderate pain.    . Multiple Vitamin (MULTIVITAMIN WITH MINERALS) TABS tablet Take 1 tablet by mouth daily.    Marland Kitchen neomycin-polymyxin b-dexamethasone (MAXITROL) 3.5-10000-0.1 OINT     . primidone (MYSOLINE) 50 MG tablet Take 50 mg by mouth 2 (two) times daily.     . traMADol (ULTRAM) 50 MG tablet Take 50 mg by mouth 4 (four) times daily as needed.    . traZODone (DESYREL) 100 MG tablet 100 mg at bedtime as needed.    . nitroGLYCERIN (NITROSTAT) 0.4 MG SL tablet Place 1 tablet (0.4 mg  total) under the tongue every 5 (five) minutes as needed. 30 tablet 3   No current facility-administered medications for this visit.    REVIEW OF SYSTEMS:  [X]  denotes positive finding, [ ]  denotes negative finding Cardiac  Comments:  Chest pain or chest pressure:    Shortness of breath upon exertion:    Short of breath when lying flat:    Irregular heart rhythm:        Vascular    Pain in calf, thigh, or hip brought on by ambulation:    Pain in feet at night that wakes you up from your sleep:     Blood clot in your veins:    Leg swelling:  Pulmonary    Oxygen at home:    Productive cough:     Wheezing:         Neurologic    Sudden weakness in arms or legs:     Sudden numbness in arms or legs:     Sudden onset of difficulty speaking or slurred speech:    Temporary loss of vision in one eye:     Problems with dizziness:         Gastrointestinal    Blood in stool:     Vomited blood:         Genitourinary    Burning when urinating:     Blood in urine:        Psychiatric    Major depression:         Hematologic    Bleeding problems:    Problems with blood clotting too easily:        Skin    Rashes or ulcers:        Constitutional    Fever or chills:     PHYSICAL EXAM:   Vitals:   01/24/20 1424 01/24/20 1426  BP: 108/69 126/72  Pulse: 93   Resp: 20   Temp: 98.1 F (36.7 C)   SpO2: 94%   Weight: 121 lb (54.9 kg)   Height: 5\' 2"  (1.575 m)     GENERAL: The patient is a well-nourished female, in no acute distress. The vital signs are documented above. CARDIAC: There is a regular rate and rhythm.  VASCULAR: I do not detect carotid bruits. She has palpable radial pulses bilaterally. She has palpable posterior tibial pulses bilaterally. PULMONARY: There is good air exchange bilaterally without wheezing or rales. ABDOMEN: Soft and non-tender with normal pitched bowel sounds.  MUSCULOSKELETAL: There are no major deformities or cyanosis. NEUROLOGIC:  No focal weakness or paresthesias are detected. SKIN: There are no ulcers or rashes noted. PSYCHIATRIC: The patient has a normal affect.  DATA:    CAROTID DUPLEX: I have independently interpreted her carotid duplex scan today.  On the right side there is a less than 39% stenosis.  The right vertebral artery is patent with antegrade flow.  On the left side there is a less than 39% stenosis.  The left vertebral artery is patent with antegrade flow.  Deitra Mayo [facility] [phone]

## 2020-02-01 DIAGNOSIS — E785 Hyperlipidemia, unspecified: Secondary | ICD-10-CM | POA: Diagnosis not present

## 2020-02-01 DIAGNOSIS — K219 Gastro-esophageal reflux disease without esophagitis: Secondary | ICD-10-CM | POA: Diagnosis not present

## 2020-02-01 DIAGNOSIS — M810 Age-related osteoporosis without current pathological fracture: Secondary | ICD-10-CM | POA: Diagnosis not present

## 2020-03-02 DIAGNOSIS — M810 Age-related osteoporosis without current pathological fracture: Secondary | ICD-10-CM | POA: Diagnosis not present

## 2020-03-02 DIAGNOSIS — E785 Hyperlipidemia, unspecified: Secondary | ICD-10-CM | POA: Diagnosis not present

## 2020-03-02 DIAGNOSIS — K219 Gastro-esophageal reflux disease without esophagitis: Secondary | ICD-10-CM | POA: Diagnosis not present

## 2020-03-07 DIAGNOSIS — M5417 Radiculopathy, lumbosacral region: Secondary | ICD-10-CM | POA: Diagnosis not present

## 2020-03-07 DIAGNOSIS — Z79899 Other long term (current) drug therapy: Secondary | ICD-10-CM | POA: Diagnosis not present

## 2020-03-07 DIAGNOSIS — G5603 Carpal tunnel syndrome, bilateral upper limbs: Secondary | ICD-10-CM | POA: Diagnosis not present

## 2020-03-07 DIAGNOSIS — G44209 Tension-type headache, unspecified, not intractable: Secondary | ICD-10-CM | POA: Diagnosis not present

## 2020-03-07 DIAGNOSIS — M5412 Radiculopathy, cervical region: Secondary | ICD-10-CM | POA: Diagnosis not present

## 2020-04-02 DIAGNOSIS — E785 Hyperlipidemia, unspecified: Secondary | ICD-10-CM | POA: Diagnosis not present

## 2020-04-02 DIAGNOSIS — K219 Gastro-esophageal reflux disease without esophagitis: Secondary | ICD-10-CM | POA: Diagnosis not present

## 2020-04-02 DIAGNOSIS — M810 Age-related osteoporosis without current pathological fracture: Secondary | ICD-10-CM | POA: Diagnosis not present

## 2020-04-04 DIAGNOSIS — R251 Tremor, unspecified: Secondary | ICD-10-CM | POA: Diagnosis not present

## 2020-04-04 DIAGNOSIS — R2689 Other abnormalities of gait and mobility: Secondary | ICD-10-CM | POA: Diagnosis not present

## 2020-04-04 DIAGNOSIS — Z79899 Other long term (current) drug therapy: Secondary | ICD-10-CM | POA: Diagnosis not present

## 2020-04-04 DIAGNOSIS — M545 Low back pain, unspecified: Secondary | ICD-10-CM | POA: Diagnosis not present

## 2020-04-04 DIAGNOSIS — M542 Cervicalgia: Secondary | ICD-10-CM | POA: Diagnosis not present

## 2020-04-18 DIAGNOSIS — K219 Gastro-esophageal reflux disease without esophagitis: Secondary | ICD-10-CM | POA: Diagnosis not present

## 2020-04-18 DIAGNOSIS — Z6821 Body mass index (BMI) 21.0-21.9, adult: Secondary | ICD-10-CM | POA: Diagnosis not present

## 2020-04-18 DIAGNOSIS — F4323 Adjustment disorder with mixed anxiety and depressed mood: Secondary | ICD-10-CM | POA: Diagnosis not present

## 2020-04-18 DIAGNOSIS — F17200 Nicotine dependence, unspecified, uncomplicated: Secondary | ICD-10-CM | POA: Diagnosis not present

## 2020-04-18 DIAGNOSIS — Z0000 Encounter for general adult medical examination without abnormal findings: Secondary | ICD-10-CM | POA: Diagnosis not present

## 2020-04-18 DIAGNOSIS — Z131 Encounter for screening for diabetes mellitus: Secondary | ICD-10-CM | POA: Diagnosis not present

## 2020-04-18 DIAGNOSIS — Z1211 Encounter for screening for malignant neoplasm of colon: Secondary | ICD-10-CM | POA: Diagnosis not present

## 2020-04-18 DIAGNOSIS — G4700 Insomnia, unspecified: Secondary | ICD-10-CM | POA: Diagnosis not present

## 2020-04-18 DIAGNOSIS — E782 Mixed hyperlipidemia: Secondary | ICD-10-CM | POA: Diagnosis not present

## 2020-05-02 DIAGNOSIS — G4700 Insomnia, unspecified: Secondary | ICD-10-CM | POA: Diagnosis not present

## 2020-05-02 DIAGNOSIS — K219 Gastro-esophageal reflux disease without esophagitis: Secondary | ICD-10-CM | POA: Diagnosis not present

## 2020-05-02 DIAGNOSIS — E1169 Type 2 diabetes mellitus with other specified complication: Secondary | ICD-10-CM | POA: Diagnosis not present

## 2020-05-02 DIAGNOSIS — E7849 Other hyperlipidemia: Secondary | ICD-10-CM | POA: Diagnosis not present

## 2020-05-02 DIAGNOSIS — F419 Anxiety disorder, unspecified: Secondary | ICD-10-CM | POA: Diagnosis not present

## 2020-05-06 DIAGNOSIS — M50123 Cervical disc disorder at C6-C7 level with radiculopathy: Secondary | ICD-10-CM | POA: Diagnosis not present

## 2020-05-06 DIAGNOSIS — M542 Cervicalgia: Secondary | ICD-10-CM | POA: Diagnosis not present

## 2020-05-06 DIAGNOSIS — M40202 Unspecified kyphosis, cervical region: Secondary | ICD-10-CM | POA: Diagnosis not present

## 2020-05-06 DIAGNOSIS — M5021 Other cervical disc displacement,  high cervical region: Secondary | ICD-10-CM | POA: Diagnosis not present

## 2020-05-08 DIAGNOSIS — L814 Other melanin hyperpigmentation: Secondary | ICD-10-CM | POA: Diagnosis not present

## 2020-05-08 DIAGNOSIS — L821 Other seborrheic keratosis: Secondary | ICD-10-CM | POA: Diagnosis not present

## 2020-05-08 DIAGNOSIS — D1801 Hemangioma of skin and subcutaneous tissue: Secondary | ICD-10-CM | POA: Diagnosis not present

## 2020-05-08 DIAGNOSIS — D225 Melanocytic nevi of trunk: Secondary | ICD-10-CM | POA: Diagnosis not present

## 2020-05-27 ENCOUNTER — Ambulatory Visit (AMBULATORY_SURGERY_CENTER): Payer: Medicare HMO

## 2020-05-27 ENCOUNTER — Other Ambulatory Visit: Payer: Self-pay

## 2020-05-27 VITALS — Ht 61.0 in | Wt 116.0 lb

## 2020-05-27 DIAGNOSIS — Z1211 Encounter for screening for malignant neoplasm of colon: Secondary | ICD-10-CM

## 2020-05-27 MED ORDER — NA SULFATE-K SULFATE-MG SULF 17.5-3.13-1.6 GM/177ML PO SOLN
1.0000 | Freq: Once | ORAL | 0 refills | Status: AC
Start: 2020-05-27 — End: 2020-05-27

## 2020-05-27 MED ORDER — NA SULFATE-K SULFATE-MG SULF 17.5-3.13-1.6 GM/177ML PO SOLN
1.0000 | Freq: Once | ORAL | 0 refills | Status: DC
Start: 2020-05-27 — End: 2020-05-27

## 2020-05-27 NOTE — Progress Notes (Signed)
Pre visit completed via phone call; patient verified name, DOB, and address;  No egg or soy allergy known to patient  No issues with past sedation with any surgeries or procedures No intubation problems in the past  No FH of Malignant Hyperthermia No diet pills per patient No home 02 use per patient  No blood thinners per patient  Pt denies issues with constipation  No A fib or A flutter  EMMI video via MyChart  COVID 19 guidelines implemented in [facility] today with Pt and RN  Pt is fully vaccinated  for Covid x 2; Coupon given to pt in [facility] today , Code to Pharmacy  Due to the COVID-19 pandemic we are asking patients to follow certain guidelines.  Pt aware of COVID protocols and [facility] guidelines   

## 2020-05-30 ENCOUNTER — Telehealth: Payer: Self-pay | Admitting: Gastroenterology

## 2020-05-30 DIAGNOSIS — Z1211 Encounter for screening for malignant neoplasm of colon: Secondary | ICD-10-CM

## 2020-05-30 MED ORDER — SUPREP BOWEL PREP KIT 17.5-3.13-1.6 GM/177ML PO SOLN: 1.0000 | Freq: Once | ORAL | 0 refills | Status: AC

## 2020-05-30 NOTE — Telephone Encounter (Signed)
Suprep resent to pt's pharmacy and pt made aware

## 2020-05-30 NOTE — Telephone Encounter (Signed)
Inbound call from patient stating pharmacy has not received prep script.  Is requesting be sent to [facility] on [address] in [place] please.

## 2020-06-05 ENCOUNTER — Encounter: Payer: Self-pay | Admitting: Gastroenterology

## 2020-06-11 ENCOUNTER — Ambulatory Visit (AMBULATORY_SURGERY_CENTER): Payer: Medicare HMO | Admitting: Gastroenterology

## 2020-06-11 ENCOUNTER — Encounter: Payer: Self-pay | Admitting: Gastroenterology

## 2020-06-11 ENCOUNTER — Other Ambulatory Visit: Payer: Self-pay

## 2020-06-11 VITALS — BP 119/80 | HR 70 | Temp 98.0°F | Resp 13 | Ht 61.0 in | Wt 116.0 lb

## 2020-06-11 DIAGNOSIS — D122 Benign neoplasm of ascending colon: Secondary | ICD-10-CM | POA: Diagnosis not present

## 2020-06-11 DIAGNOSIS — D127 Benign neoplasm of rectosigmoid junction: Secondary | ICD-10-CM | POA: Diagnosis not present

## 2020-06-11 DIAGNOSIS — Z1211 Encounter for screening for malignant neoplasm of colon: Secondary | ICD-10-CM

## 2020-06-11 DIAGNOSIS — D125 Benign neoplasm of sigmoid colon: Secondary | ICD-10-CM

## 2020-06-11 DIAGNOSIS — D128 Benign neoplasm of rectum: Secondary | ICD-10-CM | POA: Diagnosis not present

## 2020-06-11 MED ORDER — SODIUM CHLORIDE 0.9 % IV SOLN
500.0000 mL | Freq: Once | INTRAVENOUS | Status: DC
Start: 2020-06-11 — End: 2022-03-13

## 2020-06-11 NOTE — Patient Instructions (Signed)
YOU HAD AN ENDOSCOPIC PROCEDURE TODAY AT [facility]:   Refer to the procedure report that was given to you for any specific questions about what was found during the examination.  If the procedure report does not answer your questions, please call your gastroenterologist to clarify.  If you requested that your care partner not be given the details of your procedure findings, then the procedure report has been included in a sealed envelope for you to review at your convenience later.  YOU SHOULD EXPECT: Some feelings of bloating in the abdomen. Passage of more gas than usual.  Walking can help get rid of the air that was put into your GI tract during the procedure and reduce the bloating. If you had a lower endoscopy (such as a colonoscopy or flexible sigmoidoscopy) you may notice spotting of blood in your stool or on the toilet paper. If you underwent a bowel prep for your procedure, you may not have a normal bowel movement for a few days.  Please Note:  You might notice some irritation and congestion in your nose or some drainage.  This is from the oxygen used during your procedure.  There is no need for concern and it should clear up in a day or so.  SYMPTOMS TO REPORT IMMEDIATELY:   Following lower endoscopy (colonoscopy or flexible sigmoidoscopy):  Excessive amounts of blood in the stool  Significant tenderness or worsening of abdominal pains  Swelling of the abdomen that is new, acute  Fever of 100F or higher   For urgent or emergent issues, a gastroenterologist can be reached at any hour by calling [phone]. Do not use MyChart messaging for urgent concerns.    DIET:  We do recommend a small meal at first, but then you may proceed to your regular diet.  Drink plenty of fluids but you should avoid alcoholic beverages for 24 hours. Follow a High Fiber Diet.  MEDICATIONS: Continue present medications.  Please see handouts given to you by your recovery nurse:  Discharge instructions, High Fiber Diet, and Polyps.  Thank you for allowing [place] to provide for your healthcare needs today.  ACTIVITY:  You should plan to take it easy for the rest of today and you should NOT DRIVE or use heavy machinery until tomorrow (because of the sedation medicines used during the test).    FOLLOW UP: Our staff will call the number listed on your records 48-72 hours following your procedure to check on you and address any questions or concerns that you may have regarding the information given to you following your procedure. If we do not reach you, we will leave a message.  We will attempt to reach you two times.  During this call, we will ask if you have developed any symptoms of COVID 19. If you develop any symptoms (ie: fever, flu-like symptoms, shortness of breath, cough etc.) before then, please call [phone].  If you test positive for Covid 19 in the 2 weeks post procedure, please call and report this information to [place].    If any biopsies were taken you will be contacted by phone or by letter within the next 1-3 weeks.  Please call us at [phone] if you have not heard about the biopsies in 3 weeks.    SIGNATURES/CONFIDENTIALITY: You and/or your care partner have signed paperwork which will be entered into your electronic medical record.  These signatures attest to the fact that that the information above on your After Visit  Summary has been reviewed and is understood.  Full responsibility of the confidentiality of this discharge information lies with you and/or your care-partner. 

## 2020-06-11 NOTE — Progress Notes (Signed)
[name]  Pt's states no medical or surgical changes since previsit or office visit.  Called to room to assist during endoscopic procedure.  Patient ID and intended procedure confirmed with present staff. Received instructions for my participation in the procedure from the performing physician.

## 2020-06-11 NOTE — Progress Notes (Signed)
pt tolerated well. VSS. awake and to recovery. Report given to RN.  

## 2020-06-11 NOTE — Op Note (Signed)
[place] Patient Name: Nancy Chavez Procedure Date: 06/11/2020 1:32 PM MRN: [id number] Endoscopist: Thornton Park MD, MD Age: 65 Referring MD:  Date of Birth: [birth year] Gender: Female Account #: [id number] Procedure:                Colonoscopy Indications:              Colon cancer screening in patient at increased                            risk: Family history of 1st-degree relative with                            colon polyps at age 2 years (or older)                           Prior colonoscopy many years ago in [place] - ?                            polyps                           Brother with colon polyps diagnosed at age 67                           No known family history of colon cancer                           Sister with ovarian cancer at age 17 Medicines:                Monitored Anesthesia Care Procedure:                Pre-Anesthesia Assessment:                           - Prior to the procedure, a History and Physical                            was performed, and patient medications and                            allergies were reviewed. The patient's tolerance of                            previous anesthesia was also reviewed. The risks                            and benefits of the procedure and the sedation                            options and risks were discussed with the patient.                            All questions were answered, and informed consent  was obtained. Prior Anticoagulants: The patient has                            taken no previous anticoagulant or antiplatelet                            agents. ASA Grade Assessment: III - A patient with                            severe systemic disease. After reviewing the risks                            and benefits, the patient was deemed in                            satisfactory condition to undergo the procedure.                           After obtaining  informed consent, the colonoscope                            was passed under direct vision. Throughout the                            procedure, the patient's blood pressure, pulse, and                            oxygen saturations were monitored continuously. The                            Olympus CF-HQ190L ([id number]) Colonoscope was                            introduced through the anus and advanced to the the                            cecum, identified by appendiceal orifice and                            ileocecal valve. A second forward view of the right                            colon was performed. The colonoscopy was performed                            without difficulty. The patient tolerated the                            procedure well. The quality of the bowel                            preparation was good. The terminal ileum, ileocecal  valve, appendiceal orifice, and rectum were                            photographed. Scope In: 1:38:48 PM Scope Out: 1:56:36 PM Scope Withdrawal Time: 0 hours 15 minutes 6 seconds  Total Procedure Duration: 0 hours 17 minutes 48 seconds  Findings:                 The perianal and digital rectal examinations were                            normal.                           A few small and large-mouthed diverticula were                            found [Site].                           Six sessile polyps were found in the rectum. The                            polyps were 1 to 2 mm in size. These polyps were                            removed with a cold snare. Resection and retrieval                            were complete. Estimated blood loss was minimal.                           Two sessile polyps were found in the sigmoid colon.                            The polyps were 2 to 3 mm in size. These polyps                            were removed with a cold snare. Resection and                             retrieval were complete. Estimated blood loss was                            minimal.                           A 3 mm polyp was found in the proximal ascending                            colon. The polyp was sessile. The polyp was removed                            with a cold snare. Resection and retrieval were  complete. Estimated blood loss was minimal.                           The exam was otherwise without abnormality on                            direct and retroflexion views. Complications:            No immediate complications. Estimated blood loss:                            Minimal. Estimated Blood Loss:     Estimated blood loss was minimal. Impression:               - Mild diverticulosis.                           - Six 1 to 2 mm polyps in the rectum, removed with                            a cold snare. Resected and retrieved.                           - Two 2 to 3 mm polyps in the sigmoid colon,                            removed with a cold snare. Resected and retrieved.                           - One 3 mm polyp in the proximal ascending colon,                            removed with a cold snare. Resected and retrieved.                           - The examination was otherwise normal on direct                            and retroflexion views. Recommendation:           - Patient has a contact number available for                            emergencies. The signs and symptoms of potential                            delayed complications were discussed with the                            patient. Return to normal activities tomorrow.                            Written discharge instructions were provided to the  patient.                           - High fiber diet.                           - Continue present medications.                           - Await pathology results.                           - Repeat colonoscopy  date to be determined after                            pending pathology results are reviewed for                            surveillance.                           - Emerging evidence supports eating a diet of                            fruits, vegetables, grains, calcium, and yogurt                            while reducing red meat and alcohol may reduce the                            risk of colon cancer.                           - Thank you for allowing me to be involved in your                            colon cancer prevention. Thornton Park MD, MD 06/11/2020 2:04:25 PM This report has been signed electronically.

## 2020-06-13 ENCOUNTER — Telehealth: Payer: Self-pay

## 2020-06-13 NOTE — Telephone Encounter (Signed)
Second attempt follow up call to pt, lm on vm. 

## 2020-06-13 NOTE — Telephone Encounter (Signed)
  Follow up Call-  Call back number 06/11/2020  Post procedure Call Back phone  # [phone]  Permission to leave phone message Yes  Some recent data might be hidden     Left message

## 2020-06-23 ENCOUNTER — Encounter: Payer: Self-pay | Admitting: Gastroenterology

## 2020-07-11 DIAGNOSIS — G4701 Insomnia due to medical condition: Secondary | ICD-10-CM | POA: Diagnosis not present

## 2020-07-11 DIAGNOSIS — M542 Cervicalgia: Secondary | ICD-10-CM | POA: Diagnosis not present

## 2020-07-11 DIAGNOSIS — M545 Low back pain, unspecified: Secondary | ICD-10-CM | POA: Diagnosis not present

## 2020-07-11 DIAGNOSIS — R201 Hypoesthesia of skin: Secondary | ICD-10-CM | POA: Diagnosis not present

## 2020-07-11 DIAGNOSIS — Z79899 Other long term (current) drug therapy: Secondary | ICD-10-CM | POA: Diagnosis not present

## 2020-07-17 DIAGNOSIS — F4323 Adjustment disorder with mixed anxiety and depressed mood: Secondary | ICD-10-CM | POA: Diagnosis not present

## 2020-07-17 DIAGNOSIS — R052 Subacute cough: Secondary | ICD-10-CM | POA: Diagnosis not present

## 2020-07-17 DIAGNOSIS — E782 Mixed hyperlipidemia: Secondary | ICD-10-CM | POA: Diagnosis not present

## 2020-07-17 DIAGNOSIS — Z6821 Body mass index (BMI) 21.0-21.9, adult: Secondary | ICD-10-CM | POA: Diagnosis not present

## 2020-08-19 DIAGNOSIS — F3341 Major depressive disorder, recurrent, in partial remission: Secondary | ICD-10-CM | POA: Diagnosis not present

## 2020-08-19 DIAGNOSIS — H9191 Unspecified hearing loss, right ear: Secondary | ICD-10-CM | POA: Diagnosis not present

## 2020-08-19 DIAGNOSIS — Z6820 Body mass index (BMI) 20.0-20.9, adult: Secondary | ICD-10-CM | POA: Diagnosis not present

## 2020-09-02 DIAGNOSIS — F17210 Nicotine dependence, cigarettes, uncomplicated: Secondary | ICD-10-CM | POA: Diagnosis not present

## 2020-09-02 DIAGNOSIS — Z7983 Long term (current) use of bisphosphonates: Secondary | ICD-10-CM | POA: Diagnosis not present

## 2020-09-02 DIAGNOSIS — E1165 Type 2 diabetes mellitus with hyperglycemia: Secondary | ICD-10-CM | POA: Diagnosis not present

## 2020-09-02 DIAGNOSIS — R45851 Suicidal ideations: Secondary | ICD-10-CM | POA: Diagnosis not present

## 2020-09-02 DIAGNOSIS — E7800 Pure hypercholesterolemia, unspecified: Secondary | ICD-10-CM | POA: Diagnosis not present

## 2020-09-02 DIAGNOSIS — Z20822 Contact with and (suspected) exposure to covid-19: Secondary | ICD-10-CM | POA: Diagnosis not present

## 2020-09-02 DIAGNOSIS — F332 Major depressive disorder, recurrent severe without psychotic features: Secondary | ICD-10-CM | POA: Diagnosis not present

## 2020-09-02 DIAGNOSIS — F5105 Insomnia due to other mental disorder: Secondary | ICD-10-CM | POA: Diagnosis not present

## 2020-09-02 DIAGNOSIS — G8929 Other chronic pain: Secondary | ICD-10-CM | POA: Diagnosis not present

## 2020-09-03 DIAGNOSIS — F332 Major depressive disorder, recurrent severe without psychotic features: Secondary | ICD-10-CM

## 2020-09-03 DIAGNOSIS — Z20822 Contact with and (suspected) exposure to covid-19: Secondary | ICD-10-CM | POA: Diagnosis not present

## 2020-09-03 DIAGNOSIS — R45851 Suicidal ideations: Secondary | ICD-10-CM | POA: Diagnosis not present

## 2020-09-03 HISTORY — DX: Major depressive disorder, recurrent severe without psychotic features: F33.2

## 2020-09-04 DIAGNOSIS — F332 Major depressive disorder, recurrent severe without psychotic features: Secondary | ICD-10-CM | POA: Diagnosis not present

## 2020-09-05 DIAGNOSIS — F332 Major depressive disorder, recurrent severe without psychotic features: Secondary | ICD-10-CM | POA: Diagnosis not present

## 2020-09-06 DIAGNOSIS — F332 Major depressive disorder, recurrent severe without psychotic features: Secondary | ICD-10-CM | POA: Diagnosis not present

## 2020-09-07 DIAGNOSIS — F332 Major depressive disorder, recurrent severe without psychotic features: Secondary | ICD-10-CM | POA: Diagnosis not present

## 2020-09-07 DIAGNOSIS — R45851 Suicidal ideations: Secondary | ICD-10-CM | POA: Diagnosis not present

## 2020-09-09 DIAGNOSIS — Z6820 Body mass index (BMI) 20.0-20.9, adult: Secondary | ICD-10-CM | POA: Diagnosis not present

## 2020-09-09 DIAGNOSIS — G4700 Insomnia, unspecified: Secondary | ICD-10-CM | POA: Diagnosis not present

## 2020-09-09 DIAGNOSIS — F419 Anxiety disorder, unspecified: Secondary | ICD-10-CM | POA: Diagnosis not present

## 2020-09-09 DIAGNOSIS — F3341 Major depressive disorder, recurrent, in partial remission: Secondary | ICD-10-CM | POA: Diagnosis not present

## 2020-09-09 DIAGNOSIS — H66002 Acute suppurative otitis media without spontaneous rupture of ear drum, left ear: Secondary | ICD-10-CM | POA: Diagnosis not present

## 2020-09-09 DIAGNOSIS — D72829 Elevated white blood cell count, unspecified: Secondary | ICD-10-CM | POA: Diagnosis not present

## 2020-09-13 DIAGNOSIS — R202 Paresthesia of skin: Secondary | ICD-10-CM | POA: Diagnosis not present

## 2020-09-13 DIAGNOSIS — M545 Low back pain, unspecified: Secondary | ICD-10-CM | POA: Diagnosis not present

## 2020-09-13 DIAGNOSIS — G5603 Carpal tunnel syndrome, bilateral upper limbs: Secondary | ICD-10-CM | POA: Diagnosis not present

## 2020-09-13 DIAGNOSIS — R251 Tremor, unspecified: Secondary | ICD-10-CM | POA: Diagnosis not present

## 2020-09-13 DIAGNOSIS — Z8673 Personal history of transient ischemic attack (TIA), and cerebral infarction without residual deficits: Secondary | ICD-10-CM | POA: Diagnosis not present

## 2020-09-13 DIAGNOSIS — M542 Cervicalgia: Secondary | ICD-10-CM | POA: Diagnosis not present

## 2020-09-17 DIAGNOSIS — F3341 Major depressive disorder, recurrent, in partial remission: Secondary | ICD-10-CM | POA: Diagnosis not present

## 2020-09-17 DIAGNOSIS — G4700 Insomnia, unspecified: Secondary | ICD-10-CM | POA: Diagnosis not present

## 2020-09-17 DIAGNOSIS — F419 Anxiety disorder, unspecified: Secondary | ICD-10-CM | POA: Diagnosis not present

## 2020-09-17 DIAGNOSIS — Z6820 Body mass index (BMI) 20.0-20.9, adult: Secondary | ICD-10-CM | POA: Diagnosis not present

## 2020-10-01 DIAGNOSIS — J3489 Other specified disorders of nose and nasal sinuses: Secondary | ICD-10-CM | POA: Diagnosis not present

## 2020-10-01 DIAGNOSIS — F3341 Major depressive disorder, recurrent, in partial remission: Secondary | ICD-10-CM | POA: Diagnosis not present

## 2020-10-01 DIAGNOSIS — K219 Gastro-esophageal reflux disease without esophagitis: Secondary | ICD-10-CM | POA: Diagnosis not present

## 2020-10-01 DIAGNOSIS — Z6820 Body mass index (BMI) 20.0-20.9, adult: Secondary | ICD-10-CM | POA: Diagnosis not present

## 2020-10-16 DIAGNOSIS — F3341 Major depressive disorder, recurrent, in partial remission: Secondary | ICD-10-CM | POA: Diagnosis not present

## 2020-10-16 DIAGNOSIS — K219 Gastro-esophageal reflux disease without esophagitis: Secondary | ICD-10-CM | POA: Diagnosis not present

## 2020-10-16 DIAGNOSIS — R634 Abnormal weight loss: Secondary | ICD-10-CM | POA: Diagnosis not present

## 2020-10-16 DIAGNOSIS — Z681 Body mass index (BMI) 19 or less, adult: Secondary | ICD-10-CM | POA: Diagnosis not present

## 2020-10-28 ENCOUNTER — Encounter: Payer: Self-pay | Admitting: Cardiology

## 2020-10-28 ENCOUNTER — Other Ambulatory Visit: Payer: Self-pay

## 2020-10-28 ENCOUNTER — Ambulatory Visit (INDEPENDENT_AMBULATORY_CARE_PROVIDER_SITE_OTHER): Payer: Medicare HMO | Admitting: Cardiology

## 2020-10-28 VITALS — BP 110/70 | HR 117 | Ht 61.0 in | Wt 108.4 lb

## 2020-10-28 DIAGNOSIS — E1165 Type 2 diabetes mellitus with hyperglycemia: Secondary | ICD-10-CM

## 2020-10-28 DIAGNOSIS — I493 Ventricular premature depolarization: Secondary | ICD-10-CM | POA: Diagnosis not present

## 2020-10-28 DIAGNOSIS — E782 Mixed hyperlipidemia: Secondary | ICD-10-CM

## 2020-10-28 DIAGNOSIS — Z8673 Personal history of transient ischemic attack (TIA), and cerebral infarction without residual deficits: Secondary | ICD-10-CM

## 2020-10-28 MED ORDER — PROPRANOLOL HCL 10 MG PO TABS: 10.0000 mg | ORAL_TABLET | Freq: Every evening | ORAL | 3 refills | Status: DC

## 2020-10-28 NOTE — Progress Notes (Signed)
Cardiology Office Note:    Date:  10/28/2020   ID:  Nancy Chavez, DOB 1956/01/16, MRN [id number]  PCP:  Serita Grammes, MD  Cardiologist:  Berniece Salines, DO  Electrophysiologist:  None   Referring MD: [facility], [place]   No chief complaint on file. I feel much better but I still have some palpitations  History of Present Illness:    Nancy Chavez is a 65 y.o. female with a hx of hx of hyperlipidemia, history of TIA, type 2 diabetes, premature family history of coronary artery disease and PVCs.  The patient has been for follow-up visit.  Patient complained of chest pain he did order Lexiscan for the patient which she underwent this testing in March 21 which was reported normal.  I last saw the patient on 08/13/2020.  At that time she was post hospitalization for TIA and was pending neurology work-up.  We talked about her rare PVCs and plan was to hold off on any medication.  Since I saw the patient she has had some difficult social issues where she ended up staying at a behavioral health facility for 4 days given the fact that she was undergoing major depressive disorder.  She is doing very well now.  She still is experiencing some palpitations.  He tells me mostly in the evening when she about to lie down it feels more like her PVCs she says.  No chest pain, no shortness of breath no lightheadedness no dizziness.  Past Medical History:  Diagnosis Date   Acquired spondylolisthesis 04/11/2012   Overview:  L4/5   Allergy    seasonal allergies   Anxiety 07/20/2017   on meds   Asymptomatic microscopic hematuria 12/09/2016   Backache 04/11/2012   Benzodiazepine dependence ([place]) 07/12/2017   Cancer ([place])    Carpal tunnel syndrome of right wrist 09/03/2014   Cerebral artery occlusion with cerebral infarction ([place]) 08/10/2017   to stop smoking,  d/c coumadin,  begin asa qd   Cerebral artery occlusion with cerebral infarction ([place]) 2002/2016   Chronic back pain    Chronic radicular  cervical pain 08/10/2017   followed by neurology   Colitis 08/10/2017   continue meds per ER   Cyst of thyroid 08/10/2017   Diverticulosis 08/10/2017   Drug withdrawal syndrome ([place]) 07/12/2017   Dysphagia 05/19/2017   Elevated glucose 08/10/2017   Epigastric pain 07/29/2017   Gastroesophageal reflux disease without esophagitis 08/10/2017   diet controlled as of 05/27/2020   Hiatal hernia 08/10/2017   History of colon polyps 08/10/2017   History of CVA (cerebrovascular accident) 12/24/2014   Formatting of this note might be different from the original. 2006   Hormone replacement therapy (HRT) 06/17/2017   Hx-TIA (transient ischemic attack) 12/24/2014   Hypercalcemia 08/10/2017   Insomnia 07/20/2017   Low thyroid stimulating hormone (TSH) level 01/20/2018   Lumbar radiculopathy, chronic 08/10/2017   Formatting of this note might be different from the original. followed by neurology   Menopause 08/10/2017   script for prevo   Mixed dyslipidemia 08/10/2017   at goal,  continue low fat low cholesterol diet.   Mixed emotional features as adjustment reaction 08/10/2017   Mixed hyperlipidemia 08/10/2017   on meds   Neck pain 08/10/2017   Prednisone 20mg  2 qd x7d and Lortab 5mg  q4hrs prn #40. If pain persists or worsens may need MRI.   Osteoporosis 2021   confirmed by bone density testing   Pain in joint, pelvic region and thigh 04/11/2012  Palpitations 06/21/2019   Precordial pain 06/21/2019   Primary osteoarthritis of first carpometacarpal joint of right hand 09/03/2014   generalized arthritis   PVC (premature ventricular contraction) 06/21/2019   Recurrent nephrolithiasis 12/13/2016   Renal cyst 12/13/2016   S/P lumbar fusion 05/13/2016   Seizures ([place]) 2018   Shortness of breath 06/21/2019   Situational mixed anxiety and depressive disorder 08/10/2017   on meds   Spinal stenosis of lumbar region without neurogenic claudication 04/11/2012   Overview:  L4/5   Squamous cell carcinoma of right lower leg    Stress  incontinence, female 08/10/2017   Thoracic or lumbosacral neuritis or radiculitis 04/11/2012   Overview:  L4-S1 proven by EMG ICD-10 cut over    Tobacco abuse 08/10/2017   stopped smoking 8 weeks ago-  currently taking chantix   Transient ischemic attack (TIA)    Weight loss, unintentional 01/20/2018    Past Surgical History:  Procedure Laterality Date   BACK SURGERY  2014   CHOLECYSTECTOMY     COLONOSCOPY     FOOT SURGERY Right    HIP SURGERY Bilateral 2020/2021   Hip replacement   TONSILLECTOMY     TOTAL ABDOMINAL HYSTERECTOMY W/ BILATERAL SALPINGOOPHORECTOMY  1984   WISDOM TOOTH EXTRACTION      Current Medications: Current Meds  Medication Sig   amitriptyline (ELAVIL) 25 MG tablet Take 25 mg by mouth at bedtime.   atorvastatin (LIPITOR) 80 MG tablet Take 40 mg by mouth daily.   buPROPion (WELLBUTRIN SR) 100 MG 12 hr tablet Take 100 mg by mouth at bedtime.    gabapentin (NEURONTIN) 600 MG tablet TAKE ONE TABLET BY MOUTH EVERY MORNING and TAKE TWO TABLETS BY MOUTH AT BEDTIME   HYDROcodone-acetaminophen (NORCO) 10-325 MG tablet Take 1 tablet by mouth every 6 (six) hours as needed for moderate pain.   ibuprofen (ADVIL,MOTRIN) 200 MG tablet Take 800 mg by mouth every 6 (six) hours as needed for moderate pain.   Multiple Vitamin (MULTIVITAMIN WITH MINERALS) TABS tablet Take 1 tablet by mouth daily.   nitroGLYCERIN (NITROSTAT) 0.4 MG SL tablet Place 1 tablet (0.4 mg total) under the tongue every 5 (five) minutes as needed.   omeprazole (PRILOSEC) 40 MG capsule Take 40 mg by mouth daily.   propranolol (INDERAL) 10 MG tablet Take 1 tablet (10 mg total) by mouth at bedtime.   trazodone (DESYREL) 300 MG tablet Take 300 mg by mouth at bedtime as needed for sleep.   Current Facility-Administered Medications for the 10/28/20 encounter (Office Visit) with Berniece Salines, DO  Medication   0.9 %  sodium chloride infusion     Allergies:   Oxycodone and Metoprolol succinate [metoprolol]   Social  History   Socioeconomic History   Marital status: Married    Spouse name: Not on file   Number of children: Not on file   Years of education: Not on file   Highest education level: Not on file  Occupational History   Not on file  Tobacco Use   Smoking status: Some Days    Packs/day: 0.50    Pack years: 0.00    Types: Cigarettes   Smokeless tobacco: Never   Tobacco comments:    less than 1/2 pack daily  Vaping Use   Vaping Use: Never used  Substance and Sexual Activity   Alcohol use: No   Drug use: Not Currently    Types: Marijuana   Sexual activity: Not on file  Other Topics Concern   Not on  file  Social History Narrative   Not on file   Social Determinants of Health   Financial Resource Strain: Not on file  Food Insecurity: Not on file  Transportation Needs: Not on file  Physical Activity: Not on file  Stress: Not on file  Social Connections: Not on file     Family History: The patient's family history includes Cancer in her brother, brother, and sister; Colon polyps (age of onset: 44) in her brother; Diabetes in her brother; Heart failure in her mother. There is no history of Colon cancer, Esophageal cancer, Stomach cancer, or Rectal cancer.  ROS:   Review of Systems  Constitution: Negative for decreased appetite, fever and weight gain.  HENT: Negative for congestion, ear discharge, hoarse voice and sore throat.   Eyes: Negative for discharge, redness, vision loss in right eye and visual halos.  Cardiovascular: Negative for chest pain, dyspnea on exertion, leg swelling, orthopnea and palpitations.  Respiratory: Negative for cough, hemoptysis, shortness of breath and snoring.   Endocrine: Negative for heat intolerance and polyphagia.  Hematologic/Lymphatic: Negative for bleeding problem. Does not bruise/bleed easily.  Skin: Negative for flushing, nail changes, rash and suspicious lesions.  Musculoskeletal: Negative for arthritis, joint pain, muscle cramps,  myalgias, neck pain and stiffness.  Gastrointestinal: Negative for abdominal pain, bowel incontinence, diarrhea and excessive appetite.  Genitourinary: Negative for decreased libido, genital sores and incomplete emptying.  Neurological: Negative for brief paralysis, focal weakness, headaches and loss of balance.  Psychiatric/Behavioral: Negative for altered mental status, depression and suicidal ideas.  Allergic/Immunologic: Negative for HIV exposure and persistent infections.    EKGs/Labs/Other Studies Reviewed:    The following studies were reviewed today:   EKG: Sinus tachycardia, heart rate 70 bpm compared to prior EKG no significant change however patient now sinus tachycardia.  MRI: 22nd 2020 showed intermittent motion degradation.  No evidence of acute intracranial abnormalities including infarction.  Redemonstration of chronic right parietal lobe cortically based infarct.  Grossly unchanged right cerebellar medullary angle arachnoid cyst.   Transthoracic echocardiogram done on July 22, 2019 showed normal left ventricular chamber size, normal left ventricle ejection fraction 60 to 65%.  Diastolic filling pattern indicated impaired relaxation.  Right ventricle was normal.  Left atrium is normal in size.  Right atrium is normal size.  Mild aortic valve sclerosis.  No mitral regurgitation.  Tricuspid valve appears structurally normal.  Pulmonic valve is normal.  Aortic root, ascending aorta and aortic arch are appear to be normal.   CTA neck on July 22, 2019 shows no acute intracranial abnormalities.  No large vessel occlusion, hemodynamically significant stenosis or evidence of dissection.  Plaque at the right ICA origin causes less and 50% stenosis.    Recent Labs: No results found for requested labs within last 8760 hours.  Recent Lipid Panel No results found for: CHOL, TRIG, HDL, CHOLHDL, VLDL, LDLCALC, LDLDIRECT  Physical Exam:    VS:  BP 110/70   Pulse (!) 117   Ht 5\' 1"   (1.549 m)   Wt 108 lb 6.4 oz (49.2 kg)   SpO2 98%   BMI 20.48 kg/m     Wt Readings from Last 3 Encounters:  10/28/20 108 lb 6.4 oz (49.2 kg)  06/11/20 116 lb (52.6 kg)  05/27/20 116 lb (52.6 kg)     GEN: Well nourished, well developed in no acute distress HEENT: Normal NECK: No JVD; No carotid bruits LYMPHATICS: No lymphadenopathy CARDIAC: S1S2 noted,RRR, no murmurs, rubs, gallops RESPIRATORY:  Clear to auscultation  without rales, wheezing or rhonchi  ABDOMEN: Soft, non-tender, non-distended, +bowel sounds, no guarding. EXTREMITIES: No edema, No cyanosis, no clubbing MUSCULOSKELETAL:  No deformity  SKIN: Warm and dry NEUROLOGIC:  Alert and oriented x 3, non-focal PSYCHIATRIC:  Normal affect, good insight  ASSESSMENT:    1. PVC (premature ventricular contraction)   2. Type 2 diabetes mellitus with hyperglycemia, without long-term current use of insulin (HCC)   3. History of CVA (cerebrovascular accident)   4. Mixed dyslipidemia    PLAN:    She is experiencing some palpitations with worsening PVCs we will start low-dose propanolol 10 mg at nighttime.  This is being managed by his primary care doctor.  No adjustments for antidiabetic medications were made today.  Hyperlipidemia - continue with current statin medication.  The patient is in agreement with the above plan. The patient left the office in stable condition.  The patient will follow up in 3 months via virtual visit.  In terms of her long-term follow-up she plans to stay in [place] with one of the other cardiology providers.   Medication Adjustments/Labs and Tests Ordered: Current medicines are reviewed at length with the patient today.  Concerns regarding medicines are outlined above.  Orders Placed This Encounter  Procedures   EKG 12-Lead    Meds ordered this encounter  Medications   propranolol (INDERAL) 10 MG tablet    Sig: Take 1 tablet (10 mg total) by mouth at bedtime.    Dispense:  90 tablet     Refill:  3     Patient Instructions  Medication Instructions:   Your physician has recommended you make the following change in your medication:  START: Propranolol 10 mg at night   *If you need a refill on your cardiac medications before your next appointment, please call your pharmacy*   Lab Work:  None If you have labs (blood work) drawn today and your tests are completely normal, you will receive your results only by: [place] (if you have MyChart) OR A paper copy in the mail If you have any lab test that is abnormal or we need to change your treatment, we will call you to review the results.   Testing/Procedures:  None   Follow-Up: At [facility], you and your health needs are our priority.  As part of our continuing mission to provide you with exceptional heart care, we have created designated Provider Care Teams.  These Care Teams include your primary Cardiologist (physician) and Advanced Practice Providers (APPs -  Physician Assistants and Nurse Practitioners) who all work together to provide you with the care you need, when you need it.  We recommend signing up for the patient portal called "MyChart".  Sign up information is provided on this After Visit Summary.  MyChart is used to connect with patients for Virtual Visits (Telemedicine).  Patients are able to view lab/test results, encounter notes, upcoming appointments, etc.  Non-urgent messages can be sent to your provider as well.   To learn more about what you can do with MyChart, go to [email or web address].    Your next appointment:   3 month(s)  The format for your next appointment:   Virtual Visit   Provider:   Berniece Salines, DO   Other Instructions    Adopting a Healthy Lifestyle.  Know what a healthy weight is for you (roughly BMI <25) and aim to maintain this   Aim for 7+ servings of fruits and vegetables daily   65-80+ fluid ounces of  water or unsweet tea for healthy kidneys    Limit to max 1 drink of alcohol per day; avoid smoking/tobacco   Limit animal fats in diet for cholesterol and heart health - choose grass fed whenever available   Avoid highly processed foods, and foods high in saturated/trans fats   Aim for low stress - take time to unwind and care for your mental health   Aim for 150 min of moderate intensity exercise weekly for heart health, and weights twice weekly for bone health   Aim for 7-9 hours of sleep daily   When it comes to diets, agreement about the perfect plan isnt easy to find, even among the experts. Experts at the [facility] developed an idea known as the Healthy Eating Plate. Just imagine a plate divided into logical, healthy portions.   The emphasis is on diet quality:   Load up on vegetables and fruits - one-half of your plate: Aim for color and variety, and remember that potatoes dont count.   Go for whole grains - one-quarter of your plate: Whole wheat, barley, wheat berries, quinoa, oats, brown rice, and foods made with them. If you want pasta, go with whole wheat pasta.   Protein power - one-quarter of your plate: Fish, chicken, beans, and nuts are all healthy, versatile protein sources. Limit red meat.   The diet, however, does go beyond the plate, offering a few other suggestions.   Use healthy plant oils, such as olive, canola, soy, corn, sunflower and peanut. Check the labels, and avoid partially hydrogenated oil, which have unhealthy trans fats.   If youre thirsty, drink water. Coffee and tea are good in moderation, but skip sugary drinks and limit milk and dairy products to one or two daily servings.   The type of carbohydrate in the diet is more important than the amount. Some sources of carbohydrates, such as vegetables, fruits, whole grains, and beans-are healthier than others.   Finally, stay active  Signed, Berniece Salines, DO  10/28/2020 9:49 AM    [place]

## 2020-10-28 NOTE — Patient Instructions (Addendum)
Medication Instructions:   Your physician has recommended you make the following change in your medication:  START: Propranolol 10 mg at night   *If you need a refill on your cardiac medications before your next appointment, please call your pharmacy*   Lab Work:  None If you have labs (blood work) drawn today and your tests are completely normal, you will receive your results only by: [place] (if you have MyChart) OR A paper copy in the mail If you have any lab test that is abnormal or we need to change your treatment, we will call you to review the results.   Testing/Procedures:  None   Follow-Up: At [facility], you and your health needs are our priority.  As part of our continuing mission to provide you with exceptional heart care, we have created designated Provider Care Teams.  These Care Teams include your primary Cardiologist (physician) and Advanced Practice Providers (APPs -  Physician Assistants and Nurse Practitioners) who all work together to provide you with the care you need, when you need it.  We recommend signing up for the patient portal called "MyChart".  Sign up information is provided on this After Visit Summary.  MyChart is used to connect with patients for Virtual Visits (Telemedicine).  Patients are able to view lab/test results, encounter notes, upcoming appointments, etc.  Non-urgent messages can be sent to your provider as well.   To learn more about what you can do with MyChart, go to [email or web address].    Your next appointment:   3 month(s)  The format for your next appointment:   Virtual Visit   Provider:   Berniece Salines, DO   Other Instructions

## 2020-11-14 DIAGNOSIS — G4701 Insomnia due to medical condition: Secondary | ICD-10-CM | POA: Diagnosis not present

## 2020-11-14 DIAGNOSIS — M545 Low back pain, unspecified: Secondary | ICD-10-CM | POA: Diagnosis not present

## 2020-11-14 DIAGNOSIS — M542 Cervicalgia: Secondary | ICD-10-CM | POA: Diagnosis not present

## 2020-11-14 DIAGNOSIS — G44209 Tension-type headache, unspecified, not intractable: Secondary | ICD-10-CM | POA: Diagnosis not present

## 2020-11-14 DIAGNOSIS — G2581 Restless legs syndrome: Secondary | ICD-10-CM | POA: Diagnosis not present

## 2020-11-27 DIAGNOSIS — M4722 Other spondylosis with radiculopathy, cervical region: Secondary | ICD-10-CM | POA: Diagnosis not present

## 2020-11-27 DIAGNOSIS — M545 Low back pain, unspecified: Secondary | ICD-10-CM | POA: Diagnosis not present

## 2020-11-27 DIAGNOSIS — F17210 Nicotine dependence, cigarettes, uncomplicated: Secondary | ICD-10-CM | POA: Diagnosis not present

## 2020-11-27 DIAGNOSIS — M4712 Other spondylosis with myelopathy, cervical region: Secondary | ICD-10-CM | POA: Diagnosis not present

## 2020-11-27 DIAGNOSIS — Z981 Arthrodesis status: Secondary | ICD-10-CM | POA: Diagnosis not present

## 2020-11-27 DIAGNOSIS — G8929 Other chronic pain: Secondary | ICD-10-CM | POA: Diagnosis not present

## 2020-11-27 DIAGNOSIS — F17200 Nicotine dependence, unspecified, uncomplicated: Secondary | ICD-10-CM | POA: Diagnosis not present

## 2020-12-01 DIAGNOSIS — K219 Gastro-esophageal reflux disease without esophagitis: Secondary | ICD-10-CM | POA: Diagnosis not present

## 2020-12-01 DIAGNOSIS — M810 Age-related osteoporosis without current pathological fracture: Secondary | ICD-10-CM | POA: Diagnosis not present

## 2020-12-01 DIAGNOSIS — E785 Hyperlipidemia, unspecified: Secondary | ICD-10-CM | POA: Diagnosis not present

## 2020-12-10 DIAGNOSIS — Z1231 Encounter for screening mammogram for malignant neoplasm of breast: Secondary | ICD-10-CM | POA: Diagnosis not present

## 2021-01-08 DIAGNOSIS — F32A Depression, unspecified: Secondary | ICD-10-CM | POA: Diagnosis not present

## 2021-01-08 DIAGNOSIS — M542 Cervicalgia: Secondary | ICD-10-CM | POA: Diagnosis not present

## 2021-01-08 DIAGNOSIS — Z01812 Encounter for preprocedural laboratory examination: Secondary | ICD-10-CM | POA: Diagnosis not present

## 2021-01-08 DIAGNOSIS — Z01818 Encounter for other preprocedural examination: Secondary | ICD-10-CM | POA: Diagnosis not present

## 2021-01-08 DIAGNOSIS — Z8673 Personal history of transient ischemic attack (TIA), and cerebral infarction without residual deficits: Secondary | ICD-10-CM | POA: Diagnosis not present

## 2021-01-08 DIAGNOSIS — Z720 Tobacco use: Secondary | ICD-10-CM | POA: Diagnosis not present

## 2021-01-08 DIAGNOSIS — Z01810 Encounter for preprocedural cardiovascular examination: Secondary | ICD-10-CM | POA: Diagnosis not present

## 2021-01-08 DIAGNOSIS — E785 Hyperlipidemia, unspecified: Secondary | ICD-10-CM | POA: Diagnosis not present

## 2021-01-21 DIAGNOSIS — M1990 Unspecified osteoarthritis, unspecified site: Secondary | ICD-10-CM | POA: Diagnosis not present

## 2021-01-21 DIAGNOSIS — M4802 Spinal stenosis, cervical region: Secondary | ICD-10-CM | POA: Diagnosis not present

## 2021-01-21 DIAGNOSIS — M4712 Other spondylosis with myelopathy, cervical region: Secondary | ICD-10-CM | POA: Diagnosis not present

## 2021-01-21 DIAGNOSIS — R2689 Other abnormalities of gait and mobility: Secondary | ICD-10-CM | POA: Diagnosis not present

## 2021-01-21 DIAGNOSIS — F17210 Nicotine dependence, cigarettes, uncomplicated: Secondary | ICD-10-CM | POA: Diagnosis not present

## 2021-01-21 DIAGNOSIS — K219 Gastro-esophageal reflux disease without esophagitis: Secondary | ICD-10-CM | POA: Diagnosis not present

## 2021-01-21 DIAGNOSIS — Z8673 Personal history of transient ischemic attack (TIA), and cerebral infarction without residual deficits: Secondary | ICD-10-CM | POA: Diagnosis not present

## 2021-01-21 DIAGNOSIS — R350 Frequency of micturition: Secondary | ICD-10-CM | POA: Diagnosis not present

## 2021-01-21 DIAGNOSIS — E785 Hyperlipidemia, unspecified: Secondary | ICD-10-CM | POA: Diagnosis not present

## 2021-01-21 DIAGNOSIS — M4722 Other spondylosis with radiculopathy, cervical region: Secondary | ICD-10-CM | POA: Diagnosis not present

## 2021-01-31 ENCOUNTER — Telehealth: Payer: Medicare HMO | Admitting: Cardiology

## 2021-01-31 DIAGNOSIS — I10 Essential (primary) hypertension: Secondary | ICD-10-CM | POA: Diagnosis not present

## 2021-01-31 DIAGNOSIS — E7800 Pure hypercholesterolemia, unspecified: Secondary | ICD-10-CM | POA: Diagnosis not present

## 2021-03-31 DIAGNOSIS — M4313 Spondylolisthesis, cervicothoracic region: Secondary | ICD-10-CM | POA: Diagnosis not present

## 2021-03-31 DIAGNOSIS — Z981 Arthrodesis status: Secondary | ICD-10-CM | POA: Diagnosis not present

## 2021-04-17 DIAGNOSIS — G44209 Tension-type headache, unspecified, not intractable: Secondary | ICD-10-CM | POA: Diagnosis not present

## 2021-04-17 DIAGNOSIS — R251 Tremor, unspecified: Secondary | ICD-10-CM | POA: Diagnosis not present

## 2021-04-17 DIAGNOSIS — M545 Low back pain, unspecified: Secondary | ICD-10-CM | POA: Diagnosis not present

## 2021-04-17 DIAGNOSIS — M542 Cervicalgia: Secondary | ICD-10-CM | POA: Diagnosis not present

## 2021-04-17 DIAGNOSIS — G4701 Insomnia due to medical condition: Secondary | ICD-10-CM | POA: Diagnosis not present

## 2021-04-17 DIAGNOSIS — G2581 Restless legs syndrome: Secondary | ICD-10-CM | POA: Diagnosis not present

## 2021-05-29 DIAGNOSIS — R519 Headache, unspecified: Secondary | ICD-10-CM | POA: Diagnosis not present

## 2021-05-29 DIAGNOSIS — M5412 Radiculopathy, cervical region: Secondary | ICD-10-CM | POA: Diagnosis not present

## 2021-05-29 DIAGNOSIS — G5603 Carpal tunnel syndrome, bilateral upper limbs: Secondary | ICD-10-CM | POA: Diagnosis not present

## 2021-05-29 DIAGNOSIS — G2581 Restless legs syndrome: Secondary | ICD-10-CM | POA: Diagnosis not present

## 2021-05-29 DIAGNOSIS — R251 Tremor, unspecified: Secondary | ICD-10-CM | POA: Diagnosis not present

## 2021-05-29 DIAGNOSIS — Z79899 Other long term (current) drug therapy: Secondary | ICD-10-CM | POA: Diagnosis not present

## 2021-05-29 DIAGNOSIS — M5417 Radiculopathy, lumbosacral region: Secondary | ICD-10-CM | POA: Diagnosis not present

## 2021-06-12 DIAGNOSIS — F5101 Primary insomnia: Secondary | ICD-10-CM | POA: Diagnosis not present

## 2021-06-12 DIAGNOSIS — G43909 Migraine, unspecified, not intractable, without status migrainosus: Secondary | ICD-10-CM | POA: Diagnosis not present

## 2021-06-12 DIAGNOSIS — M545 Low back pain, unspecified: Secondary | ICD-10-CM | POA: Diagnosis not present

## 2021-06-12 DIAGNOSIS — Z79899 Other long term (current) drug therapy: Secondary | ICD-10-CM | POA: Diagnosis not present

## 2021-06-12 DIAGNOSIS — M542 Cervicalgia: Secondary | ICD-10-CM | POA: Diagnosis not present

## 2021-06-12 DIAGNOSIS — G2581 Restless legs syndrome: Secondary | ICD-10-CM | POA: Diagnosis not present

## 2021-06-20 DIAGNOSIS — N3001 Acute cystitis with hematuria: Secondary | ICD-10-CM | POA: Diagnosis not present

## 2021-06-20 DIAGNOSIS — F3341 Major depressive disorder, recurrent, in partial remission: Secondary | ICD-10-CM | POA: Diagnosis not present

## 2021-06-20 DIAGNOSIS — E782 Mixed hyperlipidemia: Secondary | ICD-10-CM | POA: Diagnosis not present

## 2021-06-20 DIAGNOSIS — Z6820 Body mass index (BMI) 20.0-20.9, adult: Secondary | ICD-10-CM | POA: Diagnosis not present

## 2021-06-20 DIAGNOSIS — Z0000 Encounter for general adult medical examination without abnormal findings: Secondary | ICD-10-CM | POA: Diagnosis not present

## 2021-06-20 DIAGNOSIS — K219 Gastro-esophageal reflux disease without esophagitis: Secondary | ICD-10-CM | POA: Diagnosis not present

## 2021-06-20 DIAGNOSIS — Z131 Encounter for screening for diabetes mellitus: Secondary | ICD-10-CM | POA: Diagnosis not present

## 2021-07-02 DIAGNOSIS — M545 Low back pain, unspecified: Secondary | ICD-10-CM | POA: Diagnosis not present

## 2021-07-02 DIAGNOSIS — M5033 Other cervical disc degeneration, cervicothoracic region: Secondary | ICD-10-CM | POA: Diagnosis not present

## 2021-07-02 DIAGNOSIS — M47812 Spondylosis without myelopathy or radiculopathy, cervical region: Secondary | ICD-10-CM | POA: Diagnosis not present

## 2021-07-02 DIAGNOSIS — G8929 Other chronic pain: Secondary | ICD-10-CM | POA: Diagnosis not present

## 2021-07-02 DIAGNOSIS — F17200 Nicotine dependence, unspecified, uncomplicated: Secondary | ICD-10-CM | POA: Diagnosis not present

## 2021-07-02 DIAGNOSIS — Z4789 Encounter for other orthopedic aftercare: Secondary | ICD-10-CM | POA: Diagnosis not present

## 2021-07-02 DIAGNOSIS — Z981 Arthrodesis status: Secondary | ICD-10-CM | POA: Diagnosis not present

## 2021-08-01 DIAGNOSIS — I10 Essential (primary) hypertension: Secondary | ICD-10-CM | POA: Diagnosis not present

## 2021-08-01 DIAGNOSIS — E785 Hyperlipidemia, unspecified: Secondary | ICD-10-CM | POA: Diagnosis not present

## 2021-08-20 DIAGNOSIS — H2513 Age-related nuclear cataract, bilateral: Secondary | ICD-10-CM | POA: Diagnosis not present

## 2021-08-21 DIAGNOSIS — Z760 Encounter for issue of repeat prescription: Secondary | ICD-10-CM | POA: Diagnosis not present

## 2021-08-21 DIAGNOSIS — M545 Low back pain, unspecified: Secondary | ICD-10-CM | POA: Diagnosis not present

## 2021-08-21 DIAGNOSIS — F5101 Primary insomnia: Secondary | ICD-10-CM | POA: Diagnosis not present

## 2021-08-21 DIAGNOSIS — G2581 Restless legs syndrome: Secondary | ICD-10-CM | POA: Diagnosis not present

## 2021-08-21 DIAGNOSIS — M542 Cervicalgia: Secondary | ICD-10-CM | POA: Diagnosis not present

## 2021-08-21 DIAGNOSIS — G43909 Migraine, unspecified, not intractable, without status migrainosus: Secondary | ICD-10-CM | POA: Diagnosis not present

## 2021-10-16 DIAGNOSIS — M545 Low back pain, unspecified: Secondary | ICD-10-CM | POA: Diagnosis not present

## 2021-10-16 DIAGNOSIS — G2581 Restless legs syndrome: Secondary | ICD-10-CM | POA: Diagnosis not present

## 2021-10-16 DIAGNOSIS — Z79899 Other long term (current) drug therapy: Secondary | ICD-10-CM | POA: Diagnosis not present

## 2021-10-16 DIAGNOSIS — G43909 Migraine, unspecified, not intractable, without status migrainosus: Secondary | ICD-10-CM | POA: Diagnosis not present

## 2021-10-16 DIAGNOSIS — M542 Cervicalgia: Secondary | ICD-10-CM | POA: Diagnosis not present

## 2021-10-16 DIAGNOSIS — F5101 Primary insomnia: Secondary | ICD-10-CM | POA: Diagnosis not present

## 2021-12-17 DIAGNOSIS — B351 Tinea unguium: Secondary | ICD-10-CM | POA: Diagnosis not present

## 2021-12-17 DIAGNOSIS — Z6820 Body mass index (BMI) 20.0-20.9, adult: Secondary | ICD-10-CM | POA: Diagnosis not present

## 2021-12-17 DIAGNOSIS — R0781 Pleurodynia: Secondary | ICD-10-CM | POA: Diagnosis not present

## 2021-12-17 DIAGNOSIS — F4323 Adjustment disorder with mixed anxiety and depressed mood: Secondary | ICD-10-CM | POA: Diagnosis not present

## 2021-12-18 DIAGNOSIS — M545 Low back pain, unspecified: Secondary | ICD-10-CM | POA: Diagnosis not present

## 2021-12-18 DIAGNOSIS — G43909 Migraine, unspecified, not intractable, without status migrainosus: Secondary | ICD-10-CM | POA: Diagnosis not present

## 2021-12-18 DIAGNOSIS — F5101 Primary insomnia: Secondary | ICD-10-CM | POA: Diagnosis not present

## 2021-12-18 DIAGNOSIS — G2581 Restless legs syndrome: Secondary | ICD-10-CM | POA: Diagnosis not present

## 2021-12-18 DIAGNOSIS — M542 Cervicalgia: Secondary | ICD-10-CM | POA: Diagnosis not present

## 2022-01-19 DIAGNOSIS — Z681 Body mass index (BMI) 19 or less, adult: Secondary | ICD-10-CM | POA: Diagnosis not present

## 2022-01-19 DIAGNOSIS — N951 Menopausal and female climacteric states: Secondary | ICD-10-CM | POA: Diagnosis not present

## 2022-01-19 DIAGNOSIS — R6889 Other general symptoms and signs: Secondary | ICD-10-CM | POA: Diagnosis not present

## 2022-01-19 DIAGNOSIS — T148XXA Other injury of unspecified body region, initial encounter: Secondary | ICD-10-CM | POA: Diagnosis not present

## 2022-01-19 DIAGNOSIS — F324 Major depressive disorder, single episode, in partial remission: Secondary | ICD-10-CM | POA: Diagnosis not present

## 2022-02-02 ENCOUNTER — Ambulatory Visit: Payer: Medicare HMO | Admitting: Podiatry

## 2022-02-19 DIAGNOSIS — M5459 Other low back pain: Secondary | ICD-10-CM | POA: Diagnosis not present

## 2022-02-19 DIAGNOSIS — G2581 Restless legs syndrome: Secondary | ICD-10-CM | POA: Diagnosis not present

## 2022-02-19 DIAGNOSIS — M542 Cervicalgia: Secondary | ICD-10-CM | POA: Diagnosis not present

## 2022-02-19 DIAGNOSIS — Z79899 Other long term (current) drug therapy: Secondary | ICD-10-CM | POA: Diagnosis not present

## 2022-02-19 DIAGNOSIS — F5101 Primary insomnia: Secondary | ICD-10-CM | POA: Diagnosis not present

## 2022-02-26 DIAGNOSIS — F17200 Nicotine dependence, unspecified, uncomplicated: Secondary | ICD-10-CM | POA: Diagnosis not present

## 2022-02-26 DIAGNOSIS — F324 Major depressive disorder, single episode, in partial remission: Secondary | ICD-10-CM | POA: Diagnosis not present

## 2022-02-26 DIAGNOSIS — N951 Menopausal and female climacteric states: Secondary | ICD-10-CM | POA: Diagnosis not present

## 2022-02-26 DIAGNOSIS — Z2821 Immunization not carried out because of patient refusal: Secondary | ICD-10-CM | POA: Diagnosis not present

## 2022-02-26 DIAGNOSIS — H6991 Unspecified Eustachian tube disorder, right ear: Secondary | ICD-10-CM | POA: Diagnosis not present

## 2022-02-26 DIAGNOSIS — Z681 Body mass index (BMI) 19 or less, adult: Secondary | ICD-10-CM | POA: Diagnosis not present

## 2022-03-10 DIAGNOSIS — D72829 Elevated white blood cell count, unspecified: Secondary | ICD-10-CM | POA: Diagnosis not present

## 2022-03-10 DIAGNOSIS — I700 Atherosclerosis of aorta: Secondary | ICD-10-CM | POA: Diagnosis not present

## 2022-03-10 DIAGNOSIS — I5020 Unspecified systolic (congestive) heart failure: Secondary | ICD-10-CM | POA: Diagnosis not present

## 2022-03-10 DIAGNOSIS — I214 Non-ST elevation (NSTEMI) myocardial infarction: Secondary | ICD-10-CM | POA: Diagnosis not present

## 2022-03-10 DIAGNOSIS — R1111 Vomiting without nausea: Secondary | ICD-10-CM | POA: Diagnosis not present

## 2022-03-10 DIAGNOSIS — D751 Secondary polycythemia: Secondary | ICD-10-CM | POA: Diagnosis not present

## 2022-03-10 DIAGNOSIS — R112 Nausea with vomiting, unspecified: Secondary | ICD-10-CM | POA: Diagnosis not present

## 2022-03-10 DIAGNOSIS — I249 Acute ischemic heart disease, unspecified: Secondary | ICD-10-CM | POA: Diagnosis not present

## 2022-03-10 DIAGNOSIS — Z720 Tobacco use: Secondary | ICD-10-CM | POA: Diagnosis not present

## 2022-03-10 DIAGNOSIS — Z8249 Family history of ischemic heart disease and other diseases of the circulatory system: Secondary | ICD-10-CM | POA: Diagnosis not present

## 2022-03-10 DIAGNOSIS — E785 Hyperlipidemia, unspecified: Secondary | ICD-10-CM | POA: Diagnosis not present

## 2022-03-10 DIAGNOSIS — Z79899 Other long term (current) drug therapy: Secondary | ICD-10-CM | POA: Diagnosis not present

## 2022-03-10 DIAGNOSIS — I2510 Atherosclerotic heart disease of native coronary artery without angina pectoris: Secondary | ICD-10-CM | POA: Diagnosis not present

## 2022-03-10 DIAGNOSIS — E7800 Pure hypercholesterolemia, unspecified: Secondary | ICD-10-CM | POA: Diagnosis not present

## 2022-03-10 DIAGNOSIS — Z9049 Acquired absence of other specified parts of digestive tract: Secondary | ICD-10-CM | POA: Diagnosis not present

## 2022-03-10 DIAGNOSIS — Z8673 Personal history of transient ischemic attack (TIA), and cerebral infarction without residual deficits: Secondary | ICD-10-CM | POA: Diagnosis not present

## 2022-03-10 DIAGNOSIS — F17210 Nicotine dependence, cigarettes, uncomplicated: Secondary | ICD-10-CM | POA: Diagnosis not present

## 2022-03-10 DIAGNOSIS — K2100 Gastro-esophageal reflux disease with esophagitis, without bleeding: Secondary | ICD-10-CM | POA: Diagnosis not present

## 2022-03-10 DIAGNOSIS — M1990 Unspecified osteoarthritis, unspecified site: Secondary | ICD-10-CM | POA: Diagnosis not present

## 2022-03-10 DIAGNOSIS — E119 Type 2 diabetes mellitus without complications: Secondary | ICD-10-CM | POA: Diagnosis not present

## 2022-03-10 DIAGNOSIS — K5790 Diverticulosis of intestine, part unspecified, without perforation or abscess without bleeding: Secondary | ICD-10-CM | POA: Diagnosis not present

## 2022-03-10 DIAGNOSIS — I252 Old myocardial infarction: Secondary | ICD-10-CM | POA: Diagnosis not present

## 2022-03-10 DIAGNOSIS — R000 Tachycardia, unspecified: Secondary | ICD-10-CM | POA: Diagnosis not present

## 2022-03-10 DIAGNOSIS — I493 Ventricular premature depolarization: Secondary | ICD-10-CM | POA: Diagnosis not present

## 2022-03-10 DIAGNOSIS — I519 Heart disease, unspecified: Secondary | ICD-10-CM | POA: Diagnosis not present

## 2022-03-10 DIAGNOSIS — K6389 Other specified diseases of intestine: Secondary | ICD-10-CM | POA: Diagnosis not present

## 2022-03-10 DIAGNOSIS — K209 Esophagitis, unspecified without bleeding: Secondary | ICD-10-CM | POA: Diagnosis not present

## 2022-03-10 DIAGNOSIS — J449 Chronic obstructive pulmonary disease, unspecified: Secondary | ICD-10-CM | POA: Diagnosis not present

## 2022-03-10 DIAGNOSIS — R1010 Upper abdominal pain, unspecified: Secondary | ICD-10-CM | POA: Diagnosis not present

## 2022-03-10 DIAGNOSIS — I5181 Takotsubo syndrome: Secondary | ICD-10-CM | POA: Diagnosis not present

## 2022-03-10 DIAGNOSIS — N281 Cyst of kidney, acquired: Secondary | ICD-10-CM | POA: Diagnosis not present

## 2022-03-11 DIAGNOSIS — Z720 Tobacco use: Secondary | ICD-10-CM

## 2022-03-11 DIAGNOSIS — I5181 Takotsubo syndrome: Secondary | ICD-10-CM

## 2022-03-11 DIAGNOSIS — I249 Acute ischemic heart disease, unspecified: Secondary | ICD-10-CM

## 2022-03-11 DIAGNOSIS — I519 Heart disease, unspecified: Secondary | ICD-10-CM

## 2022-03-11 DIAGNOSIS — R112 Nausea with vomiting, unspecified: Secondary | ICD-10-CM

## 2022-03-11 DIAGNOSIS — K209 Esophagitis, unspecified without bleeding: Secondary | ICD-10-CM

## 2022-03-13 ENCOUNTER — Observation Stay (HOSPITAL_COMMUNITY)
Admission: RE | Admit: 2022-03-13 | Discharge: 2022-03-13 | Disposition: A | Discharge status: Home / Self Care | Payer: Medicare HMO | Attending: Internal Medicine | Admitting: Internal Medicine

## 2022-03-13 ENCOUNTER — Other Ambulatory Visit: Payer: Self-pay

## 2022-03-13 ENCOUNTER — Encounter (HOSPITAL_COMMUNITY)
Admission: RE | Disposition: A | Discharge status: Home / Self Care | Payer: Self-pay | Source: Home / Self Care | Attending: Internal Medicine

## 2022-03-13 ENCOUNTER — Encounter (HOSPITAL_COMMUNITY): Payer: Self-pay | Admitting: Internal Medicine

## 2022-03-13 DIAGNOSIS — E785 Hyperlipidemia, unspecified: Secondary | ICD-10-CM | POA: Diagnosis present

## 2022-03-13 DIAGNOSIS — K2100 Gastro-esophageal reflux disease with esophagitis, without bleeding: Secondary | ICD-10-CM | POA: Diagnosis not present

## 2022-03-13 DIAGNOSIS — Z79899 Other long term (current) drug therapy: Secondary | ICD-10-CM | POA: Diagnosis not present

## 2022-03-13 DIAGNOSIS — I5181 Takotsubo syndrome: Secondary | ICD-10-CM

## 2022-03-13 DIAGNOSIS — K209 Esophagitis, unspecified without bleeding: Secondary | ICD-10-CM | POA: Insufficient documentation

## 2022-03-13 DIAGNOSIS — I2510 Atherosclerotic heart disease of native coronary artery without angina pectoris: Secondary | ICD-10-CM | POA: Diagnosis not present

## 2022-03-13 DIAGNOSIS — Z8249 Family history of ischemic heart disease and other diseases of the circulatory system: Secondary | ICD-10-CM | POA: Insufficient documentation

## 2022-03-13 DIAGNOSIS — Z8673 Personal history of transient ischemic attack (TIA), and cerebral infarction without residual deficits: Secondary | ICD-10-CM | POA: Insufficient documentation

## 2022-03-13 DIAGNOSIS — I252 Old myocardial infarction: Secondary | ICD-10-CM | POA: Diagnosis not present

## 2022-03-13 DIAGNOSIS — I249 Acute ischemic heart disease, unspecified: Secondary | ICD-10-CM

## 2022-03-13 DIAGNOSIS — I5020 Unspecified systolic (congestive) heart failure: Secondary | ICD-10-CM | POA: Diagnosis not present

## 2022-03-13 DIAGNOSIS — I493 Ventricular premature depolarization: Secondary | ICD-10-CM | POA: Diagnosis present

## 2022-03-13 DIAGNOSIS — F17210 Nicotine dependence, cigarettes, uncomplicated: Secondary | ICD-10-CM | POA: Diagnosis not present

## 2022-03-13 HISTORY — DX: Acute ischemic heart disease, unspecified: I24.9

## 2022-03-13 HISTORY — DX: Esophagitis, unspecified without bleeding: K20.90

## 2022-03-13 HISTORY — PX: RIGHT/LEFT HEART CATH AND CORONARY ANGIOGRAPHY: CATH118266

## 2022-03-13 HISTORY — DX: Takotsubo syndrome: I51.81

## 2022-03-13 LAB — POCT I-STAT 7, (LYTES, BLD GAS, ICA,H+H)
Acid-base deficit: 2 mmol/L (ref 0.0–2.0)
Bicarbonate: 21.3 mmol/L (ref 20.0–28.0)
Calcium, Ion: 1.24 mmol/L (ref 1.15–1.40)
HCT: 40 % (ref 36.0–46.0)
Hemoglobin: 13.6 g/dL (ref 12.0–15.0)
O2 Saturation: 95 %
Potassium: 3.7 mmol/L (ref 3.5–5.1)
Sodium: 138 mmol/L (ref 135–145)
TCO2: 22 mmol/L (ref 22–32)
pCO2 arterial: 30.8 mmHg — ABNORMAL LOW (ref 32–48)
pH, Arterial: 7.448 (ref 7.35–7.45)
pO2, Arterial: 69 mmHg — ABNORMAL LOW (ref 83–108)

## 2022-03-13 LAB — POCT I-STAT EG7
Acid-Base Excess: 0 mmol/L (ref 0.0–2.0)
Acid-Base Excess: 0 mmol/L (ref 0.0–2.0)
Bicarbonate: 24.1 mmol/L (ref 20.0–28.0)
Bicarbonate: 24.6 mmol/L (ref 20.0–28.0)
Calcium, Ion: 1.25 mmol/L (ref 1.15–1.40)
Calcium, Ion: 1.25 mmol/L (ref 1.15–1.40)
HCT: 41 % (ref 36.0–46.0)
HCT: 41 % (ref 36.0–46.0)
Hemoglobin: 13.9 g/dL (ref 12.0–15.0)
Hemoglobin: 13.9 g/dL (ref 12.0–15.0)
O2 Saturation: 65 %
O2 Saturation: 67 %
Potassium: 3.7 mmol/L (ref 3.5–5.1)
Potassium: 3.7 mmol/L (ref 3.5–5.1)
Sodium: 139 mmol/L (ref 135–145)
Sodium: 139 mmol/L (ref 135–145)
TCO2: 25 mmol/L (ref 22–32)
TCO2: 26 mmol/L (ref 22–32)
pCO2, Ven: 36.9 mmHg — ABNORMAL LOW (ref 44–60)
pCO2, Ven: 39.1 mmHg — ABNORMAL LOW (ref 44–60)
pH, Ven: 7.407 (ref 7.25–7.43)
pH, Ven: 7.422 (ref 7.25–7.43)
pO2, Ven: 33 mmHg (ref 32–45)
pO2, Ven: 34 mmHg (ref 32–45)

## 2022-03-13 SURGERY — RIGHT/LEFT HEART CATH AND CORONARY ANGIOGRAPHY
Anesthesia: LOCAL

## 2022-03-13 MED ORDER — HEPARIN SODIUM (PORCINE) 1000 UNIT/ML IJ SOLN
INTRAMUSCULAR | Status: DC | PRN
  Administered 2022-03-13: 5000 [IU] via INTRAVENOUS

## 2022-03-13 MED ORDER — HYDRALAZINE HCL 20 MG/ML IJ SOLN: 10.0000 mg | INTRAMUSCULAR | Status: DC | PRN

## 2022-03-13 MED ORDER — MIDAZOLAM HCL 2 MG/2ML IJ SOLN
INTRAMUSCULAR | Status: DC | PRN
  Administered 2022-03-13: 1 mg via INTRAVENOUS

## 2022-03-13 MED ORDER — SODIUM CHLORIDE 0.9 % IV SOLN: INTRAVENOUS | Status: DC

## 2022-03-13 MED ORDER — SODIUM CHLORIDE 0.9% FLUSH: 3.0000 mL | INTRAVENOUS | Status: DC | PRN

## 2022-03-13 MED ORDER — ASPIRIN 81 MG PO CHEW: 81.0000 mg | CHEWABLE_TABLET | ORAL | Status: DC

## 2022-03-13 MED ORDER — LABETALOL HCL 5 MG/ML IV SOLN: 10.0000 mg | INTRAVENOUS | Status: DC | PRN

## 2022-03-13 MED ORDER — LISINOPRIL 5 MG PO TABS: 5.0000 mg | ORAL_TABLET | Freq: Every day | ORAL | 6 refills | Status: DC

## 2022-03-13 MED ORDER — MIDAZOLAM HCL 2 MG/2ML IJ SOLN
INTRAMUSCULAR | Status: AC
  Filled 2022-03-13: qty 2

## 2022-03-13 MED ORDER — HEPARIN (PORCINE) IN NACL 1000-0.9 UT/500ML-% IV SOLN
INTRAVENOUS | Status: DC | PRN
  Administered 2022-03-13 (×2): 500 mL

## 2022-03-13 MED ORDER — FENTANYL CITRATE (PF) 100 MCG/2ML IJ SOLN
INTRAMUSCULAR | Status: AC
  Filled 2022-03-13: qty 2

## 2022-03-13 MED ORDER — VERAPAMIL HCL 2.5 MG/ML IV SOLN
INTRAVENOUS | Status: DC | PRN
  Administered 2022-03-13: 10 mL via INTRA_ARTERIAL

## 2022-03-13 MED ORDER — ONDANSETRON HCL 4 MG/2ML IJ SOLN: 4.0000 mg | Freq: Four times a day (QID) | INTRAMUSCULAR | Status: DC | PRN

## 2022-03-13 MED ORDER — ACETAMINOPHEN 325 MG PO TABS: 650.0000 mg | ORAL_TABLET | ORAL | Status: DC | PRN

## 2022-03-13 MED ORDER — LIDOCAINE HCL (PF) 1 % IJ SOLN
INTRAMUSCULAR | Status: AC
  Filled 2022-03-13: qty 30

## 2022-03-13 MED ORDER — CARVEDILOL 3.125 MG PO TABS: 3.1250 mg | ORAL_TABLET | Freq: Two times a day (BID) | ORAL | 3 refills | Status: DC

## 2022-03-13 MED ORDER — SODIUM CHLORIDE 0.9% FLUSH
3.0000 mL | Freq: Two times a day (BID) | INTRAVENOUS | Status: DC
  Administered 2022-03-13: 3 mL via INTRAVENOUS

## 2022-03-13 MED ORDER — VERAPAMIL HCL 2.5 MG/ML IV SOLN
INTRAVENOUS | Status: AC
  Filled 2022-03-13: qty 2

## 2022-03-13 MED ORDER — IOHEXOL 350 MG/ML SOLN
INTRAVENOUS | Status: DC | PRN
  Administered 2022-03-13: 64 mL

## 2022-03-13 MED ORDER — HEPARIN (PORCINE) IN NACL 1000-0.9 UT/500ML-% IV SOLN
INTRAVENOUS | Status: AC
  Filled 2022-03-13: qty 1000

## 2022-03-13 MED ORDER — HEPARIN SODIUM (PORCINE) 1000 UNIT/ML IJ SOLN
INTRAMUSCULAR | Status: AC
  Filled 2022-03-13: qty 10

## 2022-03-13 MED ORDER — SODIUM CHLORIDE 0.9% FLUSH: 3.0000 mL | Freq: Two times a day (BID) | INTRAVENOUS | Status: DC

## 2022-03-13 MED ORDER — SODIUM CHLORIDE 0.9 % IV SOLN: 250.0000 mL | INTRAVENOUS | Status: DC | PRN

## 2022-03-13 MED ORDER — FENTANYL CITRATE (PF) 100 MCG/2ML IJ SOLN
INTRAMUSCULAR | Status: DC | PRN
  Administered 2022-03-13: 25 ug via INTRAVENOUS

## 2022-03-13 MED ORDER — LIDOCAINE HCL (PF) 1 % IJ SOLN
INTRAMUSCULAR | Status: DC | PRN
  Administered 2022-03-13 (×2): 2 mL via INTRADERMAL

## 2022-03-13 SURGICAL SUPPLY — 17 items
CATH DIAG 6FR JR4 (CATHETERS) IMPLANT
CATH DIAG 6FR PIGTAIL ANGLED (CATHETERS) IMPLANT
CATH OPTITORQUE TIG 4.0 6F (CATHETERS) IMPLANT
CATH SWAN GANZ 7F STRAIGHT (CATHETERS) IMPLANT
DEVICE RAD COMP TR BAND LRG (VASCULAR PRODUCTS) IMPLANT
GLIDESHEATH SLEND SS 6F .021 (SHEATH) IMPLANT
KIT HEART LEFT (KITS) ×1 IMPLANT
PACK CARDIAC CATHETERIZATION (CUSTOM PROCEDURE TRAY) ×1 IMPLANT
SHEATH PINNACLE 7F 10CM (SHEATH) IMPLANT
SHEATH PROBE COVER 6X72 (BAG) IMPLANT
STOPCOCK MORSE 400PSI 3WAY (MISCELLANEOUS) IMPLANT
SYR MEDRAD MARK V 150ML (SYRINGE) IMPLANT
TRANSDUCER W/STOPCOCK (MISCELLANEOUS) ×1 IMPLANT
TUBING CIL FLEX 10 FLL-RA (TUBING) ×1 IMPLANT
TUBING CONTRAST HIGH PRESS 48 (TUBING) IMPLANT
WIRE EMERALD 3MM-J .035X260CM (WIRE) IMPLANT
WIRE MICRO SET SILHO 5FR 7 (SHEATH) IMPLANT

## 2022-03-13 NOTE — Care Management CC44 (Signed)
Condition Code 44 Documentation Completed  Patient Details  Name: ALICIANNA LITCHFORD MRN: [id number] Date of Birth: Jan 29, 1956   Condition Code 44 given:  Yes Patient signature on Condition Code 44 notice:  Yes Documentation of 2 MD's agreement:  Yes Code 44 added to claim:  Yes    Verdell Carmine, RN 03/13/2022, 8:36 PM

## 2022-03-13 NOTE — Progress Notes (Signed)
Patient arrived on unit via bed. Patient oriented to room and unit. Patient NAD at this time.Will continue to monitor.

## 2022-03-13 NOTE — Discharge Instructions (Signed)
Medicare Outpatient Observation Notice   Patient name:  Nancy Chavez Patient number:  [id number]                                                                                                                                                                       You're a hospital outpatient receiving observation services. You are not an inpatient because:    CHest pain   Patient status is changed from inpatient to observation (outpatient)status as indicated under the Medicare Condition Code-44 Regulations, Chapter 100-04 of the Medicare Claims Processing Manual 50.3. Date of Status Change                                                                                                                                                                         Being an outpatient may affect what you pay in a hospital:   When you're a hospital outpatient, your observation stay is covered under Medicare Part B.   For Part B services, you generally pay:   A copayment for each outpatient hospital service you get. Part B copayments may vary by type of service.   20% of the Medicare-approved amount for most doctor services, after the Part B deductible.   Observation services may affect coverage and payment of your care after you leave the hospital:     If you need skilled nursing facility (SNF) care after you leave the hospital, Medicare Part A will only cover SNF care if you've had a 3-day minimum, medically necessary, inpatient hospital stay for a related illness or injury. An inpatient hospital stay begins the day the hospital admits you as an inpatient based on a doctor's order and doesn't include the day you're discharged.   If you have Medicaid, a Medicare Advantage plan or other health plan, Medicaid or the plan may have different rules for SNF coverage after you leave the hospital. Check with Medicaid or your plan.   NOTE: Medicare Part A generally  doesn't cover outpatient hospital  services, like an observation stay. However, Part A will generally cover medically necessary inpatient services if the hospital admits you as an inpatient based on a doctor's order. In most cases, you'll pay a one-time deductible for all of your inpatient hospital services for the first 60 days you're in a hospital.                                                                                                                                                                      If you have any questions about your observation services, ask the hospital staff member giving you this notice or the doctor providing your hospital care. You can also ask to speak with someone from the hospital's utilization or discharge planning department.   You can also call 1-800-MEDICARE ([phone])364-0891).  TTY users should call 7142808420.   Form CMS 92330-QTMA   Expiration 04/02/2024 OMB APPROVAL [id number]          Your costs for medications:     Generally, prescription and over-the-counter drugs, including "self-administered drugs," you get in a hospital outpatient setting (like an emergency department) aren't covered by Part B. "Self- administered drugs" are drugs you'd normally take on your own. For safety reasons, many hospitals don't allow you to take medications brought from home. If you have a Medicare prescription drug plan (Part D), your plan may help you pay for these drugs. You'll likely need to pay out-of- pocket for these drugs and submit a claim to your drug plan for a refund. Contact your drug plan for more information.                                                                                                                                                                        If you're enrolled in a Medicare Advantage plan (like an HMO or PPO) or other Medicare health plan (Part C), your costs and coverage may be different. Check with your plan  to find out about coverage for outpatient  observation services.    If you're a Qualified Medicare Beneficiary through your state Medicaid program, you can't be billed for Part A or Part B deductibles, coinsurance, and copayments.                                                                                                                                                                      Additional Information (Optional):                                                                                                                                                                                Please sign below to show you received and understand this notice.                                         Date: 03/13/22 / Time:8:35 PM   CMS does not discriminate in its programs and activities. To request this publication in alternative format, please call: 1-800-MEDICARE or email:[email or web address].   Form CMS 99371-IRCV   Expiration 04/02/2024 OMB APPROVAL [id number]

## 2022-03-13 NOTE — Care Management Obs Status (Signed)
[place] NOTIFICATION   Patient Details  Name: Nancy Chavez MRN: [id number] Date of Birth: 15-Oct-1955   Medicare Observation Status Notification Given:  Yes    Verdell Carmine, RN 03/13/2022, 8:36 PM

## 2022-03-13 NOTE — H&P (Addendum)
Cardiology Admission History and Physical   Patient ID: Nancy Chavez MRN: [id number]; DOB: [birth year]   Admission date: 03/13/2022  PCP:  Nancy Grammes, MD   [place] Prov[facility] Cardiologist:  Nancy Salines, DO   {  Chief Complaint:  cath   Patient Profile:   Nancy Chavez is a 66 y.o. female with history of TIA, hyperlipidemia, ongoing tobacco abuse and GERD who is being seen 03/13/2022 for the evaluation of LV dysfunction.  Patient with premature family history of CAD and PVC.  Stress test 07/2019: Low risk without evidence of ischemia. Echo 07/2019: Normal EF  Last seen by Dr. Harriet Chavez 10/2020.    History of Present Illness:   Nancy Chavez initially presented to [facility] for intractable nausea and vomiting for 2 to 3 weeks.  CT revealed severe esophagitis.  Also found to have elevated troponin.  Treated with heparin for possible non-STEMI.  Reporting trended 1.19>> 1.19>>0.83>>0.89.  proBNP 7180.  Echocardiogram showed reduced LV function left 25 to 30% with wall motion abnormality suggesting Takotsubo type cardiomyopathy.  Patient never had any chest pain or shortness of breath.  Given cardiac risk factors cardiac catheterization recommended and transferred to [facility] did showed atherosclerosis and coronary artery calcification. The patient was treated with lisinopril 5 mg daily and carvedilol 3.125 mg twice daily.  Hemoglobin 13.3 Normal LFTs and TSH Potassium 4.2 Creatinine normal  Patient husband passed away 4 months ago.  She had a few episodes where she suddenly becomes severely diaphoretic  with minimal activity. No chest pain or dyspnea.  Denies orthopnea, PND, syncope, lower extremity edema or melena.   She is smoking since age 89.  Used to smoke half to 1 pack every day.  Currently smoking 6 to 7 cigarettes/day.  Reports many years ago she was on metformin but only took for a month or so due to intolerance.  Then seen by  endocrinologist and said "do not have diabetes".  Past Medical History:  Diagnosis Date   Acquired spondylolisthesis 04/11/2012   Overview:  L4/5   Allergy    seasonal allergies   Anxiety 07/20/2017   on meds   Asymptomatic microscopic hematuria 12/09/2016   Backache 04/11/2012   Benzodiazepine dependence ([place]) 07/12/2017   Cancer ([place])    Carpal tunnel syndrome of right wrist 09/03/2014   Cerebral artery occlusion with cerebral infarction ([place]) 08/10/2017   to stop smoking,  d/c coumadin,  begin asa qd   Cerebral artery occlusion with cerebral infarction ([place]) 2002/2016   Chronic back pain    Chronic radicular cervical pain 08/10/2017   followed by neurology   Colitis 08/10/2017   continue meds per ER   Cyst of thyroid 08/10/2017   Diverticulosis 08/10/2017   Drug withdrawal syndrome ([place]) 07/12/2017   Dysphagia 05/19/2017   Elevated glucose 08/10/2017   Epigastric pain 07/29/2017   Gastroesophageal reflux disease without esophagitis 08/10/2017   diet controlled as of 05/27/2020   Hiatal hernia 08/10/2017   History of colon polyps 08/10/2017   History of CVA (cerebrovascular accident) 12/24/2014   Formatting of this note might be different from the original. 2006   Hormone replacement therapy (HRT) 06/17/2017   Hx-TIA (transient ischemic attack) 12/24/2014   Hypercalcemia 08/10/2017   Insomnia 07/20/2017   Low thyroid stimulating hormone (TSH) level 01/20/2018   Lumbar radiculopathy, chronic 08/10/2017   Formatting of this note might be different from the original. followed by neurology   Menopause 08/10/2017  script for prevo   Mixed dyslipidemia 08/10/2017   at goal,  continue low fat low cholesterol diet.   Mixed emotional features as adjustment reaction 08/10/2017   Mixed hyperlipidemia 08/10/2017   on meds   Neck pain 08/10/2017   Prednisone '20mg'$  2 qd x7d and Lortab '5mg'$  q4hrs prn #40. If pain persists or worsens may need MRI.   Osteoporosis 2021   confirmed by bone density testing   Pain in joint, pelvic  region and thigh 04/11/2012   Palpitations 06/21/2019   Precordial pain 06/21/2019   Primary osteoarthritis of first carpometacarpal joint of right hand 09/03/2014   generalized arthritis   PVC (premature ventricular contraction) 06/21/2019   Recurrent nephrolithiasis 12/13/2016   Renal cyst 12/13/2016   S/P lumbar fusion 05/13/2016   Seizures ([place]) 2018   Shortness of breath 06/21/2019   Situational mixed anxiety and depressive disorder 08/10/2017   on meds   Spinal stenosis of lumbar region without neurogenic claudication 04/11/2012   Overview:  L4/5   Squamous cell carcinoma of right lower leg    Stress incontinence, female 08/10/2017   Thoracic or lumbosacral neuritis or radiculitis 04/11/2012   Overview:  L4-S1 proven by EMG ICD-10 cut over    Tobacco abuse 08/10/2017   stopped smoking 8 weeks ago-  currently taking chantix   Transient ischemic attack (TIA)    Weight loss, unintentional 01/20/2018    Past Surgical History:  Procedure Laterality Date   BACK SURGERY  2014   CHOLECYSTECTOMY     COLONOSCOPY     FOOT SURGERY Right    HIP SURGERY Bilateral 2020/2021   Hip replacement   TONSILLECTOMY     TOTAL ABDOMINAL HYSTERECTOMY W/ BILATERAL SALPINGOOPHORECTOMY  1984   WISDOM TOOTH EXTRACTION       Medications Prior to Admission: Prior to Admission medications   Medication Sig Start Date End Date Taking? Authorizing Provider  amitriptyline (ELAVIL) 25 MG tablet Take 25 mg by mouth at bedtime.    [provider]  atorvastatin (LIPITOR) 80 MG tablet Take 40 mg by mouth daily. 07/24/19   [provider]  buPROPion (WELLBUTRIN SR) 100 MG 12 hr tablet Take 100 mg by mouth at bedtime.  01/17/18   [provider]  gabapentin (NEURONTIN) 600 MG tablet TAKE ONE TABLET BY MOUTH EVERY MORNING and TAKE TWO TABLETS BY MOUTH AT BEDTIME 03/31/19   [provider]  HYDROcodone-acetaminophen (NORCO) 10-325 MG tablet Take 1 tablet by mouth every 6 (six) hours as needed for  moderate pain.    [provider]  ibuprofen (ADVIL,MOTRIN) 200 MG tablet Take 800 mg by mouth every 6 (six) hours as needed for moderate pain.    [provider]  Multiple Vitamin (MULTIVITAMIN WITH MINERALS) TABS tablet Take 1 tablet by mouth daily.    [provider]  nitroGLYCERIN (NITROSTAT) 0.4 MG SL tablet Place 1 tablet (0.4 mg total) under the tongue every 5 (five) minutes as needed. 06/21/19 10/28/20  Tobb, Kardie, DO  omeprazole (PRILOSEC) 40 MG capsule Take 40 mg by mouth daily.    [provider]  propranolol (INDERAL) 10 MG tablet Take 1 tablet (10 mg total) by mouth at bedtime. 10/28/20   Tobb, Kardie, DO  trazodone (DESYREL) 300 MG tablet Take 300 mg by mouth at bedtime as needed for sleep. 05/24/19   [provider]     Allergies:    Allergies  Allergen Reactions   Oxycodone Nausea And Vomiting   Metoprolol Succinate [Metoprolol]  Rash    Raised, red whelps   Morphine Rash    Social History:   Social History   Socioeconomic History   Marital status: Widowed    Spouse name: Not on file   Number of children: 5   Years of education: Not on file   Highest education level: Not on file  Occupational History   Not on file  Tobacco Use   Smoking status: Some Days    Packs/day: 0.50    Types: Cigarettes   Smokeless tobacco: Never   Tobacco comments:    less than 1/2 pack daily  Vaping Use   Vaping Use: Never used  Substance and Sexual Activity   Alcohol use: No   Drug use: Not Currently    Types: Marijuana   Sexual activity: Not on file  Other Topics Concern   Not on file  Social History Narrative   Not on file   Social Determinants of Health   Financial Resource Strain: Not on file  Food Insecurity: Not on file  Transportation Needs: Not on file  Physical Activity: Not on file  Stress: Not on file  Social Connections: Not on file  Intimate Partner Violence: Not on file    Family History:   The patient's family  history includes Cancer in her brother, brother, and sister; Colon polyps (age of onset: 15) in her brother; Diabetes in her brother; Heart failure in her mother. There is no history of Colon cancer, Esophageal cancer, Stomach cancer, or Rectal cancer.    ROS:  Please see the history of present illness.  All other ROS reviewed and negative.     Physical Exam/Data:   Vitals:   03/13/22 1237  BP: (!) 145/79  Pulse: 78  Resp: 16  SpO2: 96%  Weight: 48.1 kg  Height: '5\' 2"'$  (1.575 m)   No intake or output data in the 24 hours ending 03/13/22 1332    03/13/2022   12:37 PM 10/28/2020    9:19 AM 06/11/2020   12:55 PM  Last 3 Weights  Weight (lbs) 106 lb 0.7 oz 108 lb 6.4 oz 116 lb  Weight (kg) 48.1 kg 49.17 kg 52.617 kg     Body mass index is 19.4 kg/m.  General:  Well nourished, well developed, in no acute distress HEENT: normal Neck: no JVD Vascular: No carotid bruits; Distal pulses 2+ bilaterally   Cardiac:  normal S1, S2; RRR; no murmur  Lungs:  clear to auscultation bilaterally, no wheezing, rhonchi or rales  Abd: soft, nontender, no hepatomegaly  Ext: no edema Musculoskeletal:  No deformities, BUE and BLE strength normal and equal Skin: warm and dry  Neuro:  CNs 2-12 intact, no focal abnormalities noted Psych:  Normal affect    EKG:  The ECG that was done at [facility] was personally reviewed and demonstrates sinus tachycardia  Relevant CV Studies:  Stress test 07/2019 Nuclear stress EF: 55%. The left ventricular ejection fraction is normal (55-65%). There was no ST segment deviation noted during stress. No T wave inversion was noted during stress. Defect 1: There is a small fixed defect of mild severity present in the apical anterior and apex location, with normal wall motion. The study is normal. The fixed defect described above is likely soft tissue attenuation. This is a low risk study.  Laboratory Data:  High Sensitivity Troponin:  No results for  input(s): "TROPONINIHS" in the last 720 hours.    ChemistryNo results for input(s): "NA", "K", "CL", "CO2", "GLUCOSE", "  BUN", "CREATININE", "CALCIUM", "MG", "GFRNONAA", "GFRAA", "ANIONGAP" in the last 168 hours.  No results for input(s): "PROT", "ALBUMIN", "AST", "ALT", "ALKPHOS", "BILITOT" in the last 168 hours. Lipids No results for input(s): "CHOL", "TRIG", "HDL", "LABVLDL", "LDLCALC", "CHOLHDL" in the last 168 hours. HematologyNo results for input(s): "WBC", "RBC", "HGB", "HCT", "MCV", "MCH", "MCHC", "RDW", "PLT" in the last 168 hours. Thyroid No results for input(s): "TSH", "FREET4" in the last 168 hours. BNPNo results for input(s): "BNP", "PROBNP" in the last 168 hours.  DDimer No results for input(s): "DDIMER" in the last 168 hours.   Radiology/Studies:  No results found.   Assessment and Plan:   HFrEF Elevated troponin  -Presented to [facility] with intractable nausea and vomiting.  Found to have elevated troponin at 1.19>> 1.19>>0.83>>0.89.  Treated with heparin.  Echocardiogram showed reduced LV function at 25 to 30% and findings suggesting Takotsubo cardiomyopathy.  Given cardiac risk factor patient transferred to [facility] for further evaluation. -Continue lisinopril 5 mg daily -Continue carvedilol 3.125 mg twice daily - Euvolemic  3.  Tobacco smoking -Recommended cessation  4.  Intractable nausea and vomiting 5.  Esophagitis -Resolved  Risk Assessment/Risk Scores:         Severity of Illness: The appropriate patient status for this patient is OBSERVATION. Observation status is judged to be reasonable and necessary in order to provide the required intensity of service to ensure the patient's safety. The patient's presenting symptoms, physical exam findings, and initial radiographic and laboratory data in the context of their medical condition is felt to place them at decreased risk for further clinical deterioration. Furthermore, it is anticipated that  the patient will be medically stable for discharge from the hospital within 2 midnights of admission.    For questions or updates, please contact [place] Please consult [email or web address] for contact info under     Signed, Leanor Kail, PA  03/13/2022 1:32 PM    ATTENDING ATTESTATION:  After conducting a review of all available clinical information with the care team, interviewing the patient, and performing a physical exam, I agree with the findings and plan described in this note.   GEN: No acute distress.   HEENT:  MMM, no JVD, no scleral icterus Cardiac: RRR, no murmurs, rubs, or gallops.  Respiratory: Clear to auscultation bilaterally. GI: Soft, nontender, non-distended  MS: No edema; No deformity. Neuro:  Nonfocal  Vasc:  +2 radial pulses  Patient is a 66 year old female with a history of tobacco abuse, TIA, hyperlipidemia who had a lower stress test in 2021 who was admitted to an outside facility due to failure to thrive.  Of note she was admitted with nausea and vomiting and she was diagnosed with severe esophagitis.  She was found to have an elevated troponin.  In the process of her evaluation she was found to have a new cardiomyopathy with an anterior wall motion abnormality.  In terms of symptoms she denies any exertional angina and she denies any severe dyspnea on exertion.  She had no severe bleeding or bruising.  Given her new cardiomyopathy and elevated troponins we will refer her for coronary angiography to better characterize her cardiomyopathy.  Further recommendations will be informed by the results of the coronary angiography and right heart catheterization study.   Lenna Sciara, MD Pager [phone]

## 2022-03-13 NOTE — Progress Notes (Signed)
Patient alert and oriented. Discharge education completed. All questions answered. AVS given to patient. Vitals stable. Radial site level 0. All PIVs removed. All belongings sent home with patient.

## 2022-03-13 NOTE — Discharge Summary (Addendum)
Discharge Summary    Patient ID: Nancy Chavez MRN: [id number]; DOB: 11-01-1955  Admit date: 03/13/2022 Discharge date: 03/13/2022  PCP:  Serita Grammes, MD   [facility] Cardiologist:  Berniece Salines, DO   {    Discharge Diagnoses    Principal Problem:   ACS (acute coronary syndrome) ([place]) Active Problems:   HLD (hyperlipidemia)   PVC (premature ventricular contraction)   Takotsubo cardiomyopathy   Esophagitis    Diagnostic Studies/Procedures    RIGHT/LEFT HEART CATH AND CORONARY ANGIOGRAPHY   Conclusion  1.  Mild obstructive coronary artery disease. 2.  Ventriculography consistent with recovering Takotsubo cardiomyopathy with an LVEDP of 19 mmHg.   Recommendation: Medical therapy and same-day discharge with close cardiology follow-up.    Diagnostic Dominance: Right  History of Present Illness     Nancy Chavez is a 66 y.o. female with history of TIA, hyperlipidemia, ongoing tobacco abuse and GERD who transferred from [facility] the evaluation of LV dysfunction and NSTEMI.   Stress test 07/2019: Low risk without evidence of ischemia. Echo 07/2019: Normal EF  Ms. Men initially presented to [facility] for intractable nausea and vomiting for 2 to 3 weeks.  CT revealed severe esophagitis.  Also found to have elevated troponin.  Treated with heparin for possible non-STEMI.  Reporting trended 1.19>> 1.19>>0.83>>0.89.  proBNP 7180.  chocardiogram showed reduced LV function left 25 to 30% with wall motion abnormality suggesting Takotsubo type cardiomyopathy.  Patient never had any chest pain or shortness of breath.  Given cardiac risk factors cardiac catheterization recommended and transferred to [place] did showed atherosclerosis and coronary artery calcification. The patient was treated with lisinopril 5 mg daily and carvedilol 3.125 mg twice daily.   Patient husband passed away 4 months ago.  She had a few episodes  where she suddenly becomes severely diaphoretic  with minimal activity. No chest pain or dyspnea.  Denies orthopnea, PND, syncope, lower extremity edema or melena.   She is smoking since age 89.  Used to smoke half to 1 pack every day.  Currently smoking 6 to 7 cigarettes/day.   Reports many years ago she was on metformin but only took for a month or so due to intolerance. Then seen by endocrinologist and said "do not have diabetes".  Hospital Course     Consultants: None  The patient presented to cath lab directly from [facility]. Cath showed  Mild obstructive coronary artery disease. Ventriculography consistent with recovering Takotsubo cardiomyopathy with an LVEDP of 19 mmHg. The patient felt stable for discharge after bed rest. She will continue Lisinopril and Coreg.   Follow up with PCP for esophagitis. Continue PPI.   Recommended to bring all home medications.    The patient been seen by Dr. Ali Lowe today and deemed ready for discharge home. All follow-up appointments have been scheduled. Discharge medications are listed below.    Did the patient have an acute coronary syndrome (MI, NSTEMI, STEMI, etc) this admission?:  No                               Did the patient have a percutaneous coronary intervention (stent / angioplasty)?:  No.     Discharge Vitals Blood pressure 127/75, pulse (!) 0, resp. rate 16, height '5\' 2"'$  (1.575 m), weight 48.1 kg, SpO2 94 %.  Filed Weights   03/13/22 1237  Weight: 48.1 kg  Labs & Radiologic Studies    CBC Recent Labs    03/13/22 1658 03/13/22 1700  HGB 13.9 13.9  HCT 41.0 40.9   Basic Metabolic Panel Recent Labs    03/13/22 1658 03/13/22 1700  NA 139 139  K 3.7 3.7   _____________  CARDIAC CATHETERIZATION  Result Date: 03/13/2022 1.  Mild obstructive coronary artery disease. 2.  Ventriculography consistent with recovering Takotsubo cardiomyopathy with an LVEDP of 19 mmHg. Recommendation: Medical therapy and same-day  discharge with close cardiology follow-up.   Disposition   Pt is being discharged home today in good condition.  Follow-up Plans & Appointments     Follow-up Information     Elgie Collard, PA-C Follow up on 03/23/2022.   Specialty: Cardiology Why: '@9'$ :15am for cath follow up Contact information: [place] [address] [phone]         Serita Grammes, MD. Schedule an appointment as soon as possible for a visit in 1 week(s).   Specialty: Family Medicine Contact information: [place] [address] [phone]                Discharge Instructions     Diet - low sodium heart healthy   Complete by: As directed    Discharge instructions   Complete by: As directed    No driving for 48 hours. No lifting over 5 lbs for 1 week. No sexual activity for 1 week. You may return to work on 03/17/22. Keep procedure site clean & dry. If you notice increased pain, swelling, bleeding or pus, call/return!  You may shower, but no soaking baths/hot tubs/pools for 1 week.   Please bring all medication during follow up.   Increase activity slowly   Complete by: As directed         Discharge Medications   Allergies as of 03/13/2022       Reactions   Oxycodone Nausea And Vomiting   Metoprolol Succinate [metoprolol] Rash   Raised, red whelps   Morphine Rash        Medication List     STOP taking these medications    acetaminophen 325 MG tablet Commonly known as: TYLENOL   cloNIDine 0.1 MG tablet Commonly known as: CATAPRES   MILK OF MAGNESIA PO   nitroGLYCERIN 0.4 MG SL tablet Commonly known as: NITROSTAT       TAKE these medications    amitriptyline 50 MG tablet Commonly known as: ELAVIL Take 50 mg by mouth at bedtime.   aspirin EC 81 MG tablet Take 81 mg by mouth daily. Swallow whole.   buPROPion 150 MG 24 hr tablet Commonly known as: WELLBUTRIN XL Take 150 mg by mouth daily.   carvedilol 3.125 MG  tablet Commonly known as: COREG Take 1 tablet (3.125 mg total) by mouth 2 (two) times daily. Hold if systolic BP is less than 413 or if HR is less than 60.   cetirizine 10 MG tablet Commonly known as: ZYRTEC Take 10 mg by mouth daily.   cyclobenzaprine 5 MG tablet Commonly known as: FLEXERIL Take 5 mg by mouth at bedtime as needed for muscle spasms.   diazepam 5 MG tablet Commonly known as: VALIUM Take 2.5 mg by mouth every 8 (eight) hours as needed for anxiety.   estradiol 1 MG tablet Commonly known as: ESTRACE Take 1 mg by mouth daily.   gabapentin 300 MG capsule Commonly known as: NEURONTIN Take 600-1,200 mg by mouth See admin  instructions. Take 600 mg (2 capsules) by mouth every morning and 1,200 mg (4 capsules) at bedtime.   HYDROcodone-acetaminophen 10-325 MG tablet Commonly known as: NORCO Take 1.5 tablets by mouth every 8 (eight) hours as needed for moderate pain.   levocetirizine 5 MG tablet Commonly known as: XYZAL Take 5 mg by mouth at bedtime.   lisinopril 5 MG tablet Commonly known as: ZESTRIL Take 1 tablet (5 mg total) by mouth daily. Hold if systolic BP is less than 793. What changed: Another medication with the same name was removed. Continue taking this medication, and follow the directions you see here.   ondansetron 4 MG/2ML Soln injection Commonly known as: ZOFRAN Inject 4 mg into the vein every 6 (six) hours as needed for nausea or vomiting.   pantoprazole 40 MG injection Commonly known as: PROTONIX Inject 40 mg into the vein every 12 (twelve) hours.   promethazine 25 MG/ML injection Commonly known as: PHENERGAN Inject 12.5 mg into the vein every 6 (six) hours as needed for nausea or vomiting.   trazodone 300 MG tablet Commonly known as: DESYREL Take 300 mg by mouth at bedtime.           Outstanding Labs/Studies   BMP at follow up  Duration of Discharge Encounter   Greater than 30 minutes including physician  time.  SignedLeanor Kail, PA 03/13/2022, 5:46 PM  ATTENDING ATTESTATION:  After conducting a review of all available clinical information with the care team, interviewing the patient, and performing a physical exam, I agree with the findings and plan described in this note.   GEN: No acute distress.   HEENT:  MMM, no JVD, no scleral icterus Cardiac: RRR, no murmurs, rubs, or gallops.  Respiratory: Clear to auscultation bilaterally. GI: Soft, nontender, non-distended  MS: No edema; No deformity. Neuro:  Nonfocal  Vasc:  +2 radial pulses  Patient underwent cardiac catheterization which demonstrated minimal obstructive coronary artery disease and ventriculography which was consistent with recovering Takotsubo cardiomyopathy.  Was started on lisinopril and Coreg.  Will discharge today with close cardiology follow-up.  Lenna Sciara, MD Pager [phone]

## 2022-03-13 NOTE — Progress Notes (Signed)
Pt arrived to cath lab holding, [place], connected to monitor, remains on RA, denies pain, safety maintained

## 2022-03-13 NOTE — TOC Initial Note (Addendum)
200Transition of Care [facility]) - Initial/Assessment Note    Patient Details  Name: Nancy Chavez MRN: [id number] Date of Birth: 1955-09-13  Transition of [facility]) CM/SW Contact:    Verdell Carmine, RN Phone Number: 03/13/2022, 7:32 PM  Clinical Narrative:                    Patient transferred from [facility] for cardiac catheterization. The patient had a clean cath and discharge was written, however the patient is in IP status. NO UR at this time, Amion revealed Dr Bridgett Larsson as Quality. Called and left message with [facility] leader Dr Bridgett Larsson left message for Dr Hulen Skains.Messaged RN to hold patient. Messaged Director of TOC to return call.   2000 Ur and 2 Mds signed off and changed the patient to OBS. Code 44 given. No further needs identified.    Patient Goals and CMS Choice        Expected Discharge Plan and Services  DC home no needs         Expected Discharge Date: 03/13/22                                    Prior Living Arrangements/Services                       Activities of Daily Living      Permission Sought/Granted                  Emotional Assessment              Admission diagnosis:  ACS (acute coronary syndrome) [facility]) [I24.9] Patient Active Problem List   Diagnosis Date Noted   ACS (acute coronary syndrome) ([place]) 03/13/2022   Takotsubo cardiomyopathy 03/13/2022   Esophagitis 03/13/2022   MDD (major depressive disorder), recurrent severe, without psychosis ([place]) 09/03/2020   Precordial pain 06/21/2019   Shortness of breath 06/21/2019   Palpitations 06/21/2019   PVC (premature ventricular contraction) 06/21/2019   Low thyroid stimulating hormone (TSH) level 01/20/2018   Weight loss, unintentional 01/20/2018   Cerebral artery occlusion with cerebral infarction ([place]) 08/10/2017   Chronic radicular cervical pain 08/10/2017   Colitis 08/10/2017   Cyst of thyroid 08/10/2017   Diverticulosis 08/10/2017   Elevated glucose  08/10/2017   Gastroesophageal reflux disease without esophagitis 08/10/2017   Hiatal hernia 08/10/2017   History of colon polyps 08/10/2017   Hypercalcemia 08/10/2017   Menopause 08/10/2017   Mixed dyslipidemia 08/10/2017   Mixed emotional features as adjustment reaction 08/10/2017   HLD (hyperlipidemia) 08/10/2017   Neck pain 08/10/2017   Situational mixed anxiety and depressive disorder 08/10/2017   Stress incontinence, female 08/10/2017   Tobacco abuse 08/10/2017   Type 2 diabetes mellitus with hyperglycemia, without long-term current use of insulin ([place]) 08/10/2017   Lumbar radiculopathy, chronic 08/10/2017   Epigastric pain 07/29/2017   Anxiety 07/20/2017   Insomnia 07/20/2017   Benzodiazepine dependence ([place]) 07/12/2017   Drug withdrawal syndrome ([place]) 07/12/2017   Hormone replacement therapy (HRT) 06/17/2017   Dysphagia 05/19/2017   Recurrent nephrolithiasis 12/13/2016   Renal cyst 12/13/2016   Asymptomatic microscopic hematuria 12/09/2016   S/P lumbar fusion 05/13/2016   History of CVA (cerebrovascular accident) 12/24/2014   Carpal tunnel syndrome of right wrist 09/03/2014   Primary osteoarthritis of first carpometacarpal joint of right hand 09/03/2014   Acquired spondylolisthesis 04/11/2012   Backache 04/11/2012  Pain in joint, pelvic region and thigh 04/11/2012   Spinal stenosis of lumbar region without neurogenic claudication 04/11/2012   Thoracic or lumbosacral neuritis or radiculitis 04/11/2012   PCP:  Serita Grammes, MD Pharmacy:   [facility] [place], [place] - [address] [place] [address] Phone: [phone] Fax: [phone]     Social Determinants of Health (SDOH) Interventions    Readmission Risk Interventions     No data to display

## 2022-03-16 ENCOUNTER — Encounter (HOSPITAL_COMMUNITY): Payer: Self-pay | Admitting: Internal Medicine

## 2022-03-19 DIAGNOSIS — Z6820 Body mass index (BMI) 20.0-20.9, adult: Secondary | ICD-10-CM | POA: Diagnosis not present

## 2022-03-19 DIAGNOSIS — R0789 Other chest pain: Secondary | ICD-10-CM | POA: Diagnosis not present

## 2022-03-19 DIAGNOSIS — K2100 Gastro-esophageal reflux disease with esophagitis, without bleeding: Secondary | ICD-10-CM | POA: Diagnosis not present

## 2022-03-19 DIAGNOSIS — Z780 Asymptomatic menopausal state: Secondary | ICD-10-CM | POA: Diagnosis not present

## 2022-03-19 DIAGNOSIS — F430 Acute stress reaction: Secondary | ICD-10-CM | POA: Diagnosis not present

## 2022-03-20 ENCOUNTER — Telehealth: Payer: Self-pay | Admitting: *Deleted

## 2022-03-20 NOTE — Telephone Encounter (Signed)
LMOM for patient to return call to discuss rescheduling this appointment as this should not have been an open time slot on Tessa Conte's schedule template.

## 2022-03-23 ENCOUNTER — Ambulatory Visit: Payer: Medicare HMO | Admitting: Physician Assistant

## 2022-03-29 NOTE — Progress Notes (Signed)
Office Visit    Patient Name: Nancy Chavez Date of Encounter: 03/30/2022  PCP:  Serita Grammes, MD   [facility]  Cardiologist:  Berniece Salines, DO  Advanced Practice Provider:  No care team member to display Electrophysiologist:  None   HPI    Nancy Chavez is a 66 y.o. female with a past medical history significant for hyperlipidemia, history of TIA, type 2 diabetes mellitus, premature family history of coronary artery disease and PVCs presents today for follow-up appointment.  She was last seen 10/28/2020.  The patient has a history of chest pain and a Lexiscan was ordered March 2022 and reported normal.  She was hospitalized for a TIA April 2022.  When she was last seen she was having rare PVCs but the plan was to hold off on medication.  At the time of her last appointment she was having some difficult social issues where she ended up staying at a behavioral health facility 4 days given the fact she was undergoing major depressive disorder.  She was doing much better at the time of her appointment.  She was experiencing some palpitations.  Mostly these are happening in the evening when she was about to lie down.  No chest pain, no shortness of breath, no lightheadedness, and no dizziness.  Today, she tells me she was recently in the hospital and diagnosed with broken heart syndrome. She lost her husband 5 months ago and this has been very emotionally taxing. She is also dealing with some family issues and she feels responsible for being the family glue that holds everyone together. She has had some episodes of weating, lightheadedness, and dizziness over the last 15 years. She tells me she has passed out before a couple of times the most recent was about 6 weeks after her husband passed away. Sometimes she has issues with balance as well. She has palpitations a few times a day which last for a few seconds and then are gone.   Reports no shortness of breath nor  dyspnea on exertion. Reports no chest pain, pressure, or tightness. No edema, orthopnea, PND.  Past Medical History    Past Medical History:  Diagnosis Date   Acquired spondylolisthesis 04/11/2012   Overview:  L4/5   Allergy    seasonal allergies   Anxiety 07/20/2017   on meds   Asymptomatic microscopic hematuria 12/09/2016   Backache 04/11/2012   Benzodiazepine dependence ([place]) 07/12/2017   Cancer ([place])    Carpal tunnel syndrome of right wrist 09/03/2014   Cerebral artery occlusion with cerebral infarction ([place]) 08/10/2017   to stop smoking,  d/c coumadin,  begin asa qd   Cerebral artery occlusion with cerebral infarction ([place]) 2002/2016   Chronic back pain    Chronic radicular cervical pain 08/10/2017   followed by neurology   Colitis 08/10/2017   continue meds per ER   Cyst of thyroid 08/10/2017   Diverticulosis 08/10/2017   Drug withdrawal syndrome ([place]) 07/12/2017   Dysphagia 05/19/2017   Elevated glucose 08/10/2017   Epigastric pain 07/29/2017   Gastroesophageal reflux disease without esophagitis 08/10/2017   diet controlled as of 05/27/2020   Hiatal hernia 08/10/2017   History of colon polyps 08/10/2017   History of CVA (cerebrovascular accident) 12/24/2014   Formatting of this note might be different from the original. 2006   Hormone replacement therapy (HRT) 06/17/2017   Hx-TIA (transient ischemic attack) 12/24/2014   Hypercalcemia 08/10/2017   Insomnia 07/20/2017   Low  thyroid stimulating hormone (TSH) level 01/20/2018   Lumbar radiculopathy, chronic 08/10/2017   Formatting of this note might be different from the original. followed by neurology   Menopause 08/10/2017   script for prevo   Mixed dyslipidemia 08/10/2017   at goal,  continue low fat low cholesterol diet.   Mixed emotional features as adjustment reaction 08/10/2017   Mixed hyperlipidemia 08/10/2017   on meds   Neck pain 08/10/2017   Prednisone '20mg'$  2 qd x7d and Lortab '5mg'$  q4hrs prn #40. If pain persists or worsens may need MRI.    Osteoporosis 2021   confirmed by bone density testing   Pain in joint, pelvic region and thigh 04/11/2012   Palpitations 06/21/2019   Precordial pain 06/21/2019   Primary osteoarthritis of first carpometacarpal joint of right hand 09/03/2014   generalized arthritis   PVC (premature ventricular contraction) 06/21/2019   Recurrent nephrolithiasis 12/13/2016   Renal cyst 12/13/2016   S/P lumbar fusion 05/13/2016   Seizures ([place]) 2018   Shortness of breath 06/21/2019   Situational mixed anxiety and depressive disorder 08/10/2017   on meds   Spinal stenosis of lumbar region without neurogenic claudication 04/11/2012   Overview:  L4/5   Squamous cell carcinoma of right lower leg    Stress incontinence, female 08/10/2017   Thoracic or lumbosacral neuritis or radiculitis 04/11/2012   Overview:  L4-S1 proven by EMG ICD-10 cut over    Tobacco abuse 08/10/2017   stopped smoking 8 weeks ago-  currently taking chantix   Transient ischemic attack (TIA)    Weight loss, unintentional 01/20/2018   Past Surgical History:  Procedure Laterality Date   BACK SURGERY  2014   CHOLECYSTECTOMY     COLONOSCOPY     FOOT SURGERY Right    HIP SURGERY Bilateral 2020/2021   Hip replacement   RIGHT/LEFT HEART CATH AND CORONARY ANGIOGRAPHY N/A 03/13/2022   Procedure: RIGHT/LEFT HEART CATH AND CORONARY ANGIOGRAPHY;  Surgeon: Early Osmond, MD;  Location: [facility];  Service: Cardiovascular;  Laterality: N/A;   TONSILLECTOMY     TOTAL ABDOMINAL HYSTERECTOMY W/ BILATERAL SALPINGOOPHORECTOMY  1984   WISDOM TOOTH EXTRACTION      Allergies  Allergies  Allergen Reactions   Oxycodone Nausea And Vomiting   Metoprolol Succinate [Metoprolol] Rash    Raised, red whelps   Morphine Rash    EKGs/Labs/Other Studies Reviewed:   The following studies were reviewed today:   MRI: 22nd 2020 showed intermittent motion degradation.  No evidence of acute intracranial abnormalities including infarction.  Redemonstration of  chronic right parietal lobe cortically based infarct.  Grossly unchanged right cerebellar medullary angle arachnoid cyst.   Transthoracic echocardiogram done on July 22, 2019 showed normal left ventricular chamber size, normal left ventricle ejection fraction 60 to 65%.  Diastolic filling pattern indicated impaired relaxation.  Right ventricle was normal.  Left atrium is normal in size.  Right atrium is normal size.  Mild aortic valve sclerosis.  No mitral regurgitation.  Tricuspid valve appears structurally normal.  Pulmonic valve is normal.  Aortic root, ascending aorta and aortic arch are appear to be normal.   CTA neck on July 22, 2019 shows no acute intracranial abnormalities.  No large vessel occlusion, hemodynamically significant stenosis or evidence of dissection.  Plaque at the right ICA origin causes less and 50% stenosis.  EKG:  EKG is  ordered today.  The ekg ordered today demonstrates t wave inversions consistent with Takotsubo CM  Recent Labs: 03/13/2022: Hemoglobin 13.9;  Potassium 3.7; Sodium 139  Recent Lipid Panel No results found for: "CHOL", "TRIG", "HDL", "CHOLHDL", "VLDL", "LDLCALC", "LDLDIRECT"  Home Medications   Current Meds  Medication Sig   amitriptyline (ELAVIL) 50 MG tablet Take 50 mg by mouth at bedtime.   aspirin EC 81 MG tablet Take 81 mg by mouth daily. Swallow whole.   buPROPion (WELLBUTRIN XL) 150 MG 24 hr tablet Take 150 mg by mouth daily.   carvedilol (COREG) 6.25 MG tablet Take 1 tablet (6.25 mg total) by mouth 2 (two) times daily. Hold if systolic blood pressure is less than 100   diazepam (VALIUM) 5 MG tablet Take 2.5 mg by mouth every 8 (eight) hours as needed for anxiety.   estradiol (ESTRACE) 1 MG tablet Take 1 mg by mouth daily.   gabapentin (NEURONTIN) 300 MG capsule Take 600-1,200 mg by mouth See admin instructions. Take 600 mg (2 capsules) by mouth every morning and 1,200 mg (4 capsules) at bedtime.   HYDROcodone-acetaminophen (NORCO) 10-325 MG  tablet Take 1.5 tablets by mouth every 8 (eight) hours as needed for moderate pain.   lisinopril (ZESTRIL) 2.5 MG tablet Take 1 tablet (2.5 mg total) by mouth daily. Hold if systolic blood pressure is less than 100   pantoprazole (PROTONIX) 40 MG injection Inject 40 mg into the vein every 12 (twelve) hours.   trazodone (DESYREL) 300 MG tablet Take 300 mg by mouth at bedtime.   Varenicline Tartrate, Starter, 0.5 MG X 11 & 1 MG X 42 TBPK Take by mouth as directed.   [DISCONTINUED] carvedilol (COREG) 3.125 MG tablet Take 1 tablet (3.125 mg total) by mouth 2 (two) times daily. Hold if systolic BP is less than 176 or if HR is less than 60.   [DISCONTINUED] lisinopril (ZESTRIL) 5 MG tablet Take 1 tablet (5 mg total) by mouth daily. Hold if systolic BP is less than 160.     Review of Systems      All other systems reviewed and are otherwise negative except as noted above.  Physical Exam    VS:  BP 110/68   Pulse 85   Ht '5\' 2"'$  (1.575 m)   Wt 107 lb 12.8 oz (48.9 kg)   SpO2 99%   BMI 19.72 kg/m  , BMI Body mass index is 19.72 kg/m.  Wt Readings from Last 3 Encounters:  03/30/22 107 lb 12.8 oz (48.9 kg)  03/13/22 106 lb 0.7 oz (48.1 kg)  10/28/20 108 lb 6.4 oz (49.2 kg)     GEN: Well nourished, well developed, in no acute distress. HEENT: normal. Neck: Supple, no JVD, carotid bruits, or masses. Cardiac: RRR, no murmurs, rubs, or gallops. No clubbing, cyanosis, edema.  Radials/PT 2+ and equal bilaterally.  Respiratory:  Respirations regular and unlabored, clear to auscultation bilaterally. GI: Soft, nontender, nondistended. MS: No deformity or atrophy. Skin: Warm and dry, no rash. Neuro:  Strength and sensation are intact. Psych: Normal affect.  Assessment & Plan    Takotsubo CM -continue Lisinopril but decrease to 2.'5mg'$  -increase Coreg to 6.'25mg'$  BID -cardiac MRI ordered today -Echo in three months depending on MRI findings -she is okay to start slowly back at the gym, it will be  good for her to get back into a routine  Premature ventricular contraction -increased Coreg today -continue to monitor BP closely  History of CVA -no residual effects  Mixed dyslipidemia -she will need updated lipid panel next time she is in the office  Disposition: Follow up after cardiac MRI with Berniece Salines, DO or APP.  Signed, Elgie Collard, PA-C 03/30/2022, 10:12 AM [place]

## 2022-03-30 ENCOUNTER — Ambulatory Visit: Payer: Medicare HMO | Attending: Physician Assistant | Admitting: Physician Assistant

## 2022-03-30 ENCOUNTER — Encounter: Payer: Self-pay | Admitting: Physician Assistant

## 2022-03-30 VITALS — BP 110/68 | HR 85 | Ht 62.0 in | Wt 107.8 lb

## 2022-03-30 DIAGNOSIS — Z8673 Personal history of transient ischemic attack (TIA), and cerebral infarction without residual deficits: Secondary | ICD-10-CM

## 2022-03-30 DIAGNOSIS — E1165 Type 2 diabetes mellitus with hyperglycemia: Secondary | ICD-10-CM | POA: Diagnosis not present

## 2022-03-30 DIAGNOSIS — E782 Mixed hyperlipidemia: Secondary | ICD-10-CM | POA: Diagnosis not present

## 2022-03-30 DIAGNOSIS — I5181 Takotsubo syndrome: Secondary | ICD-10-CM | POA: Diagnosis not present

## 2022-03-30 DIAGNOSIS — I493 Ventricular premature depolarization: Secondary | ICD-10-CM | POA: Diagnosis not present

## 2022-03-30 LAB — HEMOGLOBIN: Hemoglobin: 14.9 g/dL (ref 11.1–15.9)

## 2022-03-30 MED ORDER — CARVEDILOL 6.25 MG PO TABS: 6.2500 mg | ORAL_TABLET | Freq: Two times a day (BID) | ORAL | 3 refills | Status: DC

## 2022-03-30 MED ORDER — LISINOPRIL 2.5 MG PO TABS: 2.5000 mg | ORAL_TABLET | Freq: Every day | ORAL | 3 refills | Status: DC

## 2022-03-30 NOTE — Patient Instructions (Addendum)
Medication Instructions:  Your physician has recommended you make the following change in your medications: 1.Increase coreg to 6.25 mg twice a day, hold if systolic blood pressure is less than 100 2.Decrease lisinopril to 2.5 mg daily, hold if systolic blood pressure is less than 100 *If you need a refill on your cardiac medications before your next appointment, please call your pharmacy*   Lab Work: Hemoglobin:TODAY Fasting lipid panel in February If you have labs (blood work) drawn today and your tests are completely normal, you will receive your results only by: [place] (if you have MyChart) OR A paper copy in the mail If you have any lab test that is abnormal or we need to change your treatment, we will call you to review the results.   Testing/Procedures: Your physician has requested that you have an echocardiogram in 3 months. Echocardiography is a painless test that uses sound waves to create images of your heart. It provides your doctor with information about the size and shape of your heart and how well your heart's chambers and valves are working. This procedure takes approximately one hour. There are no restrictions for this procedure. Please do NOT wear cologne, perfume, aftershave, or lotions (deodorant is allowed). Please arrive 15 minutes prior to your appointment time.     You are scheduled for Cardiac MRI on ______________. Please arrive for your appointment at ______________ ( arrive 30-45 minutes prior to test start time). ?  [facility] [address] [place], [place] [address] [phone] Please take advantage of the free valet parking available at the MAIN entrance (A entrance).  Proceed to the [facility] Radiology Department (First Floor) for check-in.   [facility] [place], [place] [address] [phone] Please take advantage of the free valet parking available at the MAIN  entrance. Proceed to [facility] registration for check-in (first floor).  Magnetic resonance imaging (MRI) is a painless test that produces images of the inside of the body without using Xrays.  During an MRI, strong magnets and radio waves work together in a Research officer, political party to form detailed images.   MRI images may provide more details about a medical condition than X-rays, CT scans, and ultrasounds can provide.  You may be given earphones to listen for instructions.  You may eat a light breakfast and take medications as ordered with the exception of HCTZ (fluid pill, other). Please avoid stimulants for 12 hr prior to test. (Ie. Caffeine, nicotine, chocolate, or antihistamine medications)  If a contrast material will be used, an IV will be inserted into one of your veins. Contrast material will be injected into your IV. It will leave your body through your urine within a day. You may be told to drink plenty of fluids to help flush the contrast material out of your system.  You will be asked to remove all metal, including: Watch, jewelry, and other metal objects including hearing aids, hair pieces and dentures. Also wearable glucose monitoring systems (ie. Freestyle Libre and Omnipods) (Braces and fillings normally are not a problem.)   TEST WILL TAKE APPROXIMATELY 1 HOUR  PLEASE NOTIFY SCHEDULING AT LEAST 24 HOURS IN ADVANCE IF YOU ARE UNABLE TO KEEP YOUR APPOINTMENT. [phone]  Please call Marchia Bond, cardiac imaging nurse navigator with any questions/concerns. Marchia Bond RN Navigator Cardiac Imaging Gordy Clement RN Navigator Cardiac Imaging [facility] [phone] Office     Follow-Up: At [facility],  you and your health needs are our priority.  As part of our continuing mission to provide you with exceptional heart care, we have created designated Provider Care Teams.  These Care Teams include your primary Cardiologist (physician) and  Advanced Practice Providers (APPs -  Physician Assistants and Nurse Practitioners) who all work together to provide you with the care you need, when you need it.  We recommend signing up for the patient portal called "MyChart".  Sign up information is provided on this After Visit Summary.  MyChart is used to connect with patients for Virtual Visits (Telemedicine).  Patients are able to view lab/test results, encounter notes, upcoming appointments, etc.  Non-urgent messages can be sent to your provider as well.   To learn more about what you can do with MyChart, go to [email or web address].    Your next appointment:   2 month(s) (Be sure this is after your cardiac MRI, call and reschedule this appointment if needed)  The format for your next appointment:   In Person  Provider:   Jenne Campus, MD or Shirlee More, MD  Other Instructions 1.Check your blood pressure daily for the next 2 weeks, one hour after taking your morning medications, keep a log and call us with the readings. 2.Call and let us know if you would like to be referred to behavioral health  Important Information About Sugar

## 2022-04-02 ENCOUNTER — Telehealth: Payer: Self-pay

## 2022-04-02 NOTE — Telephone Encounter (Addendum)
   Patient Name: Nancy Chavez  DOB: [birth year] MRN: [id number]  Primary Cardiologist: Berniece Salines, DO  Chart reviewed as part of pre-operative protocol coverage.   Dental extractions of 1-2 teeth are considered low risk procedures per guidelines and generally do not require any specific cardiac clearance. It is also generally accepted that for extractions of 1-2 teeth and dental cleanings, there is no need to interrupt blood thinner therapy.  SBE prophylaxis is not required for the patient from a cardiac standpoint.  I will route this recommendation to the requesting party via Epic fax function and remove from pre-op pool.  Please call with questions.  Mayra Reel, NP 04/02/2022, 4:11 PM

## 2022-04-02 NOTE — Telephone Encounter (Signed)
Preoperative team, please contact requesting office ultimately that we are not able to provide appropriate coverage.  Once details of procedure on exam we will be able to provide recommendations from a cardiac standpoint.  Please contact the office and request details of specific upcoming procedure.  Thank you for your help.  Jossie Ng. Adelia Baptista NP-C     04/02/2022, 2:24 PM [facility] [address] Office [phone] Fax [phone]

## 2022-04-02 NOTE — Telephone Encounter (Signed)
S/w dental office possibly two teeth extractions with local anesthesia

## 2022-04-02 NOTE — Telephone Encounter (Signed)
..     Pre-operative Risk Assessment    Patient Name: Nancy Chavez  DOB: 08-09-1955 MRN: [id number]     Request for Surgical Clearance    Procedure:  DEEP CLEANING, FILLINGS, CROWNS POSSIBLE EXTRACTION Date of Surgery:  Clearance TBD                                 Surgeon:  DR Francetta Found Surgeon's Group or Practice Name:  [facility] Phone number:  [phone] Fax number:  [phone]   Type of Clearance Requested:   - Medical  - Pharmacy:  Hold Aspirin     Type of Anesthesia:  Local    Additional requests/questions:    Gwenlyn Found   04/02/2022, 2:05 PM

## 2022-04-06 ENCOUNTER — Telehealth: Payer: Self-pay | Admitting: Physician Assistant

## 2022-04-06 NOTE — Telephone Encounter (Signed)
*  STAT* If patient is at the pharmacy, call can be transferred to refill team.   1. Which medications need to be refilled? (please list name of each medication and dose if known) carvedilol (COREG) 6.25 MG tablet   2. Which pharmacy/location (including street and city if local pharmacy) is medication to be sent to? [id number] - [place], [place] - [address] AT [address]    3. Do they need a 30 day or 90 day supply? 90 day  Patient states she accidentally threw away the medication

## 2022-04-09 ENCOUNTER — Other Ambulatory Visit: Payer: Self-pay

## 2022-04-15 ENCOUNTER — Other Ambulatory Visit: Payer: Medicare HMO

## 2022-05-05 DIAGNOSIS — Z780 Asymptomatic menopausal state: Secondary | ICD-10-CM | POA: Diagnosis not present

## 2022-05-05 DIAGNOSIS — L819 Disorder of pigmentation, unspecified: Secondary | ICD-10-CM | POA: Diagnosis not present

## 2022-05-05 DIAGNOSIS — Z6820 Body mass index (BMI) 20.0-20.9, adult: Secondary | ICD-10-CM | POA: Diagnosis not present

## 2022-05-05 DIAGNOSIS — J302 Other seasonal allergic rhinitis: Secondary | ICD-10-CM | POA: Diagnosis not present

## 2022-05-05 DIAGNOSIS — F324 Major depressive disorder, single episode, in partial remission: Secondary | ICD-10-CM | POA: Diagnosis not present

## 2022-05-07 DIAGNOSIS — Z79899 Other long term (current) drug therapy: Secondary | ICD-10-CM | POA: Diagnosis not present

## 2022-05-07 DIAGNOSIS — M5412 Radiculopathy, cervical region: Secondary | ICD-10-CM | POA: Diagnosis not present

## 2022-05-07 DIAGNOSIS — M5417 Radiculopathy, lumbosacral region: Secondary | ICD-10-CM | POA: Diagnosis not present

## 2022-05-07 DIAGNOSIS — G2581 Restless legs syndrome: Secondary | ICD-10-CM | POA: Diagnosis not present

## 2022-06-16 ENCOUNTER — Ambulatory Visit: Payer: Medicare HMO

## 2022-06-24 ENCOUNTER — Telehealth (HOSPITAL_COMMUNITY): Payer: Self-pay | Admitting: Emergency Medicine

## 2022-06-24 ENCOUNTER — Ambulatory Visit (HOSPITAL_COMMUNITY): Payer: Medicare HMO | Attending: Physician Assistant

## 2022-06-24 DIAGNOSIS — I5181 Takotsubo syndrome: Secondary | ICD-10-CM

## 2022-06-24 LAB — ECHOCARDIOGRAM COMPLETE
Area-P 1/2: 6.12 cm2
S' Lateral: 2.5 cm

## 2022-06-24 NOTE — Telephone Encounter (Signed)
Reaching out to patient to offer assistance regarding upcoming cardiac imaging study; pt verbalizes understanding of appt date/time, parking situation and where to check in, pre-test NPO status and medications ordered, and verified current allergies; name and call back number provided for further questions should they arise Marchia Bond RN Navigator Cardiac Imaging [facility] [phone] office [phone] cell  Arrival [address] entrance Denies iv issues Orthopedic metal to neck, back, bilat hips, and r foot Denies claustro

## 2022-06-24 NOTE — Progress Notes (Signed)
Exam observed by nicolle wallace.

## 2022-06-25 ENCOUNTER — Telehealth (HOSPITAL_COMMUNITY): Payer: Self-pay | Admitting: Emergency Medicine

## 2022-06-25 ENCOUNTER — Ambulatory Visit (HOSPITAL_COMMUNITY): Admission: RE | Admit: 2022-06-25 | Payer: Medicare HMO | Source: Ambulatory Visit

## 2022-06-25 NOTE — Telephone Encounter (Signed)
Calling to cancel CMR today LMTCB

## 2022-06-28 NOTE — Progress Notes (Deleted)
Cardiology Office Note:    Date:  06/28/2022   ID:  BRANDELYN MCLINN, DOB [birth year], MRN [id number]  PCP:  Serita Grammes, MD  Cardiologist:  Shirlee More, MD    Referring MD: Serita Grammes, MD    ASSESSMENT:    No diagnosis found. PLAN:    In order of problems listed above:  ***   Next appointment: ***   Medication Adjustments/Labs and Tests Ordered: Current medicines are reviewed at length with the patient today.  Concerns regarding medicines are outlined above.  No orders of the defined types were placed in this encounter.  No orders of the defined types were placed in this encounter.   No chief complaint on file.   History of Present Illness:    Nancy Chavez is a 67 y.o. female with a hx of symptomatic PVCs type 2 diabetes and hyperlipidemia last seen by Shaaron Adler, PA 03/30/2022.  Previous myocardial perfusion study March 2022 normal.  There is a notation she had a recent Takotsubo cardiomyopathy and hospitalization.  Coronary angiography performed 03/13/2022 showed mild CAD with hypokinesia of the apical and mid segments and minimal coronary atherosclerosis.  An ejection fraction was not estimated.  Her echocardiogram 06/24/2022 showed normal left ventricular size ejection fraction systolic function although GLS was still reduced -10.1 EF was 60 to 65% consistent with recovered Takotsubo cardiomyopathy.  Her ejection fraction at [facility] 03/11/2022 was 20 to 25% with severe hypokinesia of all segments except the basilar myocardium.  It was read as a Takotsubo cardiomyopathy. Compliance with diet, lifestyle and medications: *** Past Medical History:  Diagnosis Date   Acquired spondylolisthesis 04/11/2012   Overview:  L4/5   Allergy    seasonal allergies   Anxiety 07/20/2017   on meds   Asymptomatic microscopic hematuria 12/09/2016   Backache 04/11/2012   Benzodiazepine dependence ([place]) 07/12/2017   Cancer ([place])    Carpal tunnel syndrome of right wrist  09/03/2014   Cerebral artery occlusion with cerebral infarction ([place]) 08/10/2017   to stop smoking,  d/c coumadin,  begin asa qd   Cerebral artery occlusion with cerebral infarction ([place]) 2002/2016   Chronic back pain    Chronic radicular cervical pain 08/10/2017   followed by neurology   Colitis 08/10/2017   continue meds per ER   Cyst of thyroid 08/10/2017   Diverticulosis 08/10/2017   Drug withdrawal syndrome ([place]) 07/12/2017   Dysphagia 05/19/2017   Elevated glucose 08/10/2017   Epigastric pain 07/29/2017   Gastroesophageal reflux disease without esophagitis 08/10/2017   diet controlled as of 05/27/2020   Hiatal hernia 08/10/2017   History of colon polyps 08/10/2017   History of CVA (cerebrovascular accident) 12/24/2014   Formatting of this note might be different from the original. 2006   Hormone replacement therapy (HRT) 06/17/2017   Hx-TIA (transient ischemic attack) 12/24/2014   Hypercalcemia 08/10/2017   Insomnia 07/20/2017   Low thyroid stimulating hormone (TSH) level 01/20/2018   Lumbar radiculopathy, chronic 08/10/2017   Formatting of this note might be different from the original. followed by neurology   Menopause 08/10/2017   script for prevo   Mixed dyslipidemia 08/10/2017   at goal,  continue low fat low cholesterol diet.   Mixed emotional features as adjustment reaction 08/10/2017   Mixed hyperlipidemia 08/10/2017   on meds   Neck pain 08/10/2017   Prednisone '20mg'$  2 qd x7d and Lortab '5mg'$  q4hrs prn #40. If pain persists or worsens may need MRI.   Osteoporosis 2021  confirmed by bone density testing   Pain in joint, pelvic region and thigh 04/11/2012   Palpitations 06/21/2019   Precordial pain 06/21/2019   Primary osteoarthritis of first carpometacarpal joint of right hand 09/03/2014   generalized arthritis   PVC (premature ventricular contraction) 06/21/2019   Recurrent nephrolithiasis 12/13/2016   Renal cyst 12/13/2016   S/P lumbar fusion 05/13/2016   Seizures ([place]) 2018   Shortness of breath  06/21/2019   Situational mixed anxiety and depressive disorder 08/10/2017   on meds   Spinal stenosis of lumbar region without neurogenic claudication 04/11/2012   Overview:  L4/5   Squamous cell carcinoma of right lower leg    Stress incontinence, female 08/10/2017   Thoracic or lumbosacral neuritis or radiculitis 04/11/2012   Overview:  L4-S1 proven by EMG ICD-10 cut over    Tobacco abuse 08/10/2017   stopped smoking 8 weeks ago-  currently taking chantix   Transient ischemic attack (TIA)    Weight loss, unintentional 01/20/2018    Past Surgical History:  Procedure Laterality Date   BACK SURGERY  2014   CHOLECYSTECTOMY     COLONOSCOPY     FOOT SURGERY Right    HIP SURGERY Bilateral 2020/2021   Hip replacement   RIGHT/LEFT HEART CATH AND CORONARY ANGIOGRAPHY N/A 03/13/2022   Procedure: RIGHT/LEFT HEART CATH AND CORONARY ANGIOGRAPHY;  Surgeon: Early Osmond, MD;  Location: [facility];  Service: Cardiovascular;  Laterality: N/A;   TONSILLECTOMY     TOTAL ABDOMINAL HYSTERECTOMY W/ BILATERAL SALPINGOOPHORECTOMY  1984   WISDOM TOOTH EXTRACTION      Current Medications: No outpatient medications have been marked as taking for the 06/30/22 encounter (Appointment) with Richardo Priest, MD.     Allergies:   Oxycodone, Metoprolol succinate [metoprolol], and Morphine   Social History   Socioeconomic History   Marital status: Widowed    Spouse name: Not on file   Number of children: 5   Years of education: Not on file   Highest education level: Not on file  Occupational History   Not on file  Tobacco Use   Smoking status: Some Days    Packs/day: 0.50    Types: Cigarettes   Smokeless tobacco: Never   Tobacco comments:    less than 1/2 pack daily  Vaping Use   Vaping Use: Never used  Substance and Sexual Activity   Alcohol use: No   Drug use: Not Currently    Types: Marijuana   Sexual activity: Not on file  Other Topics Concern   Not on file  Social History Narrative    Not on file   Social Determinants of Health   Financial Resource Strain: Not on file  Food Insecurity: Not on file  Transportation Needs: Not on file  Physical Activity: Not on file  Stress: Not on file  Social Connections: Not on file     Family History: The patient's ***family history includes Cancer in her brother, brother, and sister; Colon polyps (age of onset: 95) in her brother; Diabetes in her brother; Heart failure in her mother. There is no history of Colon cancer, Esophageal cancer, Stomach cancer, or Rectal cancer. ROS:   Please see the history of present illness.    All other systems reviewed and are negative.  EKGs/Labs/Other Studies Reviewed:    The following studies were reviewed today:  EKG:  EKG ordered today and personally reviewed.  The ekg ordered today demonstrates ***  Recent Labs: 03/13/2022: Potassium 3.7; Sodium 139  03/30/2022: Hemoglobin 14.9  Recent Lipid Panel No results found for: "CHOL", "TRIG", "HDL", "CHOLHDL", "VLDL", "LDLCALC", "LDLDIRECT"  Physical Exam:    VS:  There were no vitals taken for this visit.    Wt Readings from Last 3 Encounters:  03/30/22 107 lb 12.8 oz (48.9 kg)  03/13/22 106 lb 0.7 oz (48.1 kg)  10/28/20 108 lb 6.4 oz (49.2 kg)     GEN: *** Well nourished, well developed in no acute distress HEENT: Normal NECK: No JVD; No carotid bruits LYMPHATICS: No lymphadenopathy CARDIAC: ***RRR, no murmurs, rubs, gallops RESPIRATORY:  Clear to auscultation without rales, wheezing or rhonchi  ABDOMEN: Soft, non-tender, non-distended MUSCULOSKELETAL:  No edema; No deformity  SKIN: Warm and dry NEUROLOGIC:  Alert and oriented x 3 PSYCHIATRIC:  Normal affect    Signed, Shirlee More, MD  06/28/2022 11:24 AM    [place]

## 2022-06-29 ENCOUNTER — Telehealth: Payer: Self-pay | Admitting: Cardiology

## 2022-06-29 DIAGNOSIS — C44722 Squamous cell carcinoma of skin of right lower limb, including hip: Secondary | ICD-10-CM | POA: Insufficient documentation

## 2022-06-29 DIAGNOSIS — G459 Transient cerebral ischemic attack, unspecified: Secondary | ICD-10-CM | POA: Insufficient documentation

## 2022-06-29 DIAGNOSIS — C801 Malignant (primary) neoplasm, unspecified: Secondary | ICD-10-CM | POA: Insufficient documentation

## 2022-06-29 DIAGNOSIS — T7840XA Allergy, unspecified, initial encounter: Secondary | ICD-10-CM | POA: Insufficient documentation

## 2022-06-29 NOTE — Telephone Encounter (Signed)
Patient had an appt with Dr. Bettina Gavia tomorrow that we had to r/s due to moonlighter changes. Patient's only question was if she still needed to take her Lisinopril and Carvedilol because she had been told she had "Broken Heart Syndrome" but had had a recent echo and was told everything was fine. Patient would like to know if she still needs to continue these medications. Please call [phone].  Thank you

## 2022-06-29 NOTE — Telephone Encounter (Signed)
LVM per DPR regarding message earlier today. Dr. Bettina Gavia advised to continue Lisinopril & Carvedilol.

## 2022-06-30 ENCOUNTER — Ambulatory Visit: Payer: Medicare HMO | Admitting: Cardiology

## 2022-06-30 DIAGNOSIS — I5181 Takotsubo syndrome: Secondary | ICD-10-CM

## 2022-06-30 DIAGNOSIS — I493 Ventricular premature depolarization: Secondary | ICD-10-CM

## 2022-06-30 DIAGNOSIS — E782 Mixed hyperlipidemia: Secondary | ICD-10-CM

## 2022-07-02 DIAGNOSIS — Z79899 Other long term (current) drug therapy: Secondary | ICD-10-CM | POA: Diagnosis not present

## 2022-07-02 DIAGNOSIS — Z1331 Encounter for screening for depression: Secondary | ICD-10-CM | POA: Diagnosis not present

## 2022-07-02 DIAGNOSIS — D692 Other nonthrombocytopenic purpura: Secondary | ICD-10-CM | POA: Diagnosis not present

## 2022-07-02 DIAGNOSIS — S1096XA Insect bite of unspecified part of neck, initial encounter: Secondary | ICD-10-CM | POA: Diagnosis not present

## 2022-07-02 DIAGNOSIS — Z780 Asymptomatic menopausal state: Secondary | ICD-10-CM | POA: Diagnosis not present

## 2022-07-02 DIAGNOSIS — F3341 Major depressive disorder, recurrent, in partial remission: Secondary | ICD-10-CM | POA: Diagnosis not present

## 2022-07-02 DIAGNOSIS — W57XXXA Bitten or stung by nonvenomous insect and other nonvenomous arthropods, initial encounter: Secondary | ICD-10-CM | POA: Diagnosis not present

## 2022-07-02 DIAGNOSIS — Z681 Body mass index (BMI) 19 or less, adult: Secondary | ICD-10-CM | POA: Diagnosis not present

## 2022-07-02 DIAGNOSIS — Z0000 Encounter for general adult medical examination without abnormal findings: Secondary | ICD-10-CM | POA: Diagnosis not present

## 2022-07-09 DIAGNOSIS — G2581 Restless legs syndrome: Secondary | ICD-10-CM | POA: Diagnosis not present

## 2022-07-09 DIAGNOSIS — M542 Cervicalgia: Secondary | ICD-10-CM | POA: Diagnosis not present

## 2022-07-09 DIAGNOSIS — M5459 Other low back pain: Secondary | ICD-10-CM | POA: Diagnosis not present

## 2022-07-09 DIAGNOSIS — Z79899 Other long term (current) drug therapy: Secondary | ICD-10-CM | POA: Diagnosis not present

## 2022-07-09 DIAGNOSIS — R519 Headache, unspecified: Secondary | ICD-10-CM | POA: Diagnosis not present

## 2022-08-13 DIAGNOSIS — Z6820 Body mass index (BMI) 20.0-20.9, adult: Secondary | ICD-10-CM | POA: Diagnosis not present

## 2022-08-13 DIAGNOSIS — F324 Major depressive disorder, single episode, in partial remission: Secondary | ICD-10-CM | POA: Diagnosis not present

## 2022-08-13 DIAGNOSIS — N3001 Acute cystitis with hematuria: Secondary | ICD-10-CM | POA: Diagnosis not present

## 2022-08-30 NOTE — Progress Notes (Unsigned)
05/20/2021 Cardiology Office Note:    Date:  09/01/2022   ID:  Nancy Chavez, DOB 04/09/1956, MRN [id number]  PCP:  Buckner Malta, MD  Cardiologist:  Norman Herrlich, MD    Referring MD: Buckner Malta, MD    ASSESSMENT:    1. Takotsubo cardiomyopathy   2. PVC (premature ventricular contraction)   3. Mixed dyslipidemia    PLAN:    In order of problems listed above:  Doing well has made a full recovery her blood pressure is relatively low we will stop her ACE inhibitor continue aspirin beta-blocker and I will put her on a low-dose of a low potency statin reclassified follow-up lipids in 6 weeks see me in the office in 1 year Continue her very blocker for PVCs   Next appointment: 1 year   Medication Adjustments/Labs and Tests Ordered: Current medicines are reviewed at length with the patient today.  Concerns regarding medicines are outlined above.  No orders of the defined types were placed in this encounter.  No orders of the defined types were placed in this encounter.   Chief Complaint  Patient presents with   Follow-up   Cardiomyopathy    History of Present Illness:    Nancy Chavez is a 67 y.o. female with a hx of PVCs type 2 diabetes hyperlipidemia premature family history of CAD last seen 03/30/2022.  She was seen at [facility] 03/12/2022 with Takotsubo cardiomyopathy complicated by severe left ventricular dysfunction.  She underwent left heart catheterization 03/13/2022 showing minimal luminal irregularities diffuse hypokinesia.  Her echocardiogram 06/24/2022 showed recovery EF 60 to 65% normal global and segmental function right ventricle normal size and function and no valvular abnormality.  Initial ejection fraction was 25 to 30% 03/11/2022  Compliance with diet, lifestyle and medications: Yes   She is made a complete and full recovery.  Vigorous and active greater than 30 minutes of activity a day has no cardiovascular symptoms of edema shortness  of breath chest pain palpitation or syncope  I do not know how but she has never been placed on a statin and she has mild coronary atherosclerosis on a coronary angiogram. Past Medical History:  Diagnosis Date   Acquired spondylolisthesis 04/11/2012   Overview:  L4/5   Allergy    seasonal allergies   Anxiety 07/20/2017   on meds   Asymptomatic microscopic hematuria 12/09/2016   Backache 04/11/2012   Benzodiazepine dependence (HCC) 07/12/2017   Cancer (HCC)    Carpal tunnel syndrome of right wrist 09/03/2014   Cerebral artery occlusion with cerebral infarction (HCC) 08/10/2017   to stop smoking,  d/c coumadin,  begin asa qd   Cerebral artery occlusion with cerebral infarction (HCC) 2002/2016   Chronic back pain    Chronic radicular cervical pain 08/10/2017   followed by neurology   Colitis 08/10/2017   continue meds per ER   Cyst of thyroid 08/10/2017   Diverticulosis 08/10/2017   Drug withdrawal syndrome (HCC) 07/12/2017   Dysphagia 05/19/2017   Elevated glucose 08/10/2017   Epigastric pain 07/29/2017   Gastroesophageal reflux disease without esophagitis 08/10/2017   diet controlled as of 05/27/2020   Hiatal hernia 08/10/2017   History of colon polyps 08/10/2017   History of CVA (cerebrovascular accident) 12/24/2014   Formatting of this note might be different from the original. 2006   Hormone replacement therapy (HRT) 06/17/2017   Hx-TIA (transient ischemic attack) 12/24/2014   Hypercalcemia 08/10/2017   Insomnia 07/20/2017   Low thyroid stimulating hormone (TSH) level  01/20/2018   Lumbar radiculopathy, chronic 08/10/2017   Formatting of this note might be different from the original. followed by neurology   Menopause 08/10/2017   script for prevo   Mixed dyslipidemia 08/10/2017   at goal,  continue low fat low cholesterol diet.   Mixed emotional features as adjustment reaction 08/10/2017   Mixed hyperlipidemia 08/10/2017   on meds   Neck pain 08/10/2017   Prednisone 20mg  2 qd x7d and Lortab 5mg  q4hrs prn #40.  If pain persists or worsens may need MRI.   Osteoporosis 2021   confirmed by bone density testing   Pain in joint, pelvic region and thigh 04/11/2012   Palpitations 06/21/2019   Precordial pain 06/21/2019   Primary osteoarthritis of first carpometacarpal joint of right hand 09/03/2014   generalized arthritis   PVC (premature ventricular contraction) 06/21/2019   Recurrent nephrolithiasis 12/13/2016   Renal cyst 12/13/2016   S/P lumbar fusion 05/13/2016   Seizures (HCC) 2018   Shortness of breath 06/21/2019   Situational mixed anxiety and depressive disorder 08/10/2017   on meds   Spinal stenosis of lumbar region without neurogenic claudication 04/11/2012   Overview:  L4/5   Squamous cell carcinoma of right lower leg    Stress incontinence, female 08/10/2017   Thoracic or lumbosacral neuritis or radiculitis 04/11/2012   Overview:  L4-S1 proven by EMG ICD-10 cut over    Tobacco abuse 08/10/2017   stopped smoking 8 weeks ago-  currently taking chantix   Transient ischemic attack (TIA)    Weight loss, unintentional 01/20/2018    Past Surgical History:  Procedure Laterality Date   BACK SURGERY  2014   CHOLECYSTECTOMY     COLONOSCOPY     FOOT SURGERY Right    HIP SURGERY Bilateral 2020/2021   Hip replacement   RIGHT/LEFT HEART CATH AND CORONARY ANGIOGRAPHY N/A 03/13/2022   Procedure: RIGHT/LEFT HEART CATH AND CORONARY ANGIOGRAPHY;  Surgeon: Orbie Pyo, MD;  Location: [facility] INVASIVE CV [facility];  Service: Cardiovascular;  Laterality: N/A;   TONSILLECTOMY     TOTAL ABDOMINAL HYSTERECTOMY W/ BILATERAL SALPINGOOPHORECTOMY  1984   WISDOM TOOTH EXTRACTION      Current Medications: Current Meds  Medication Sig   amitriptyline (ELAVIL) 50 MG tablet Take 50 mg by mouth at bedtime.   aspirin EC 81 MG tablet Take 81 mg by mouth daily. Swallow whole.   buPROPion (WELLBUTRIN XL) 150 MG 24 hr tablet Take 150 mg by mouth daily.   carvedilol (COREG) 6.25 MG tablet Take 1 tablet (6.25 mg total) by mouth 2 (two)  times daily. Hold if systolic blood pressure is less than 100   diazepam (VALIUM) 5 MG tablet Take 2.5 mg by mouth every 8 (eight) hours as needed for anxiety.   estradiol (ESTRACE) 1 MG tablet Take 1 mg by mouth daily.   gabapentin (NEURONTIN) 300 MG capsule Take 600-1,200 mg by mouth See admin instructions. Take 600 mg (2 capsules) by mouth every morning and 1,200 mg (4 capsules) at bedtime.   HYDROcodone-acetaminophen (NORCO) 10-325 MG tablet Take 1.5 tablets by mouth every 8 (eight) hours as needed for moderate pain.   lisinopril (ZESTRIL) 2.5 MG tablet Take 1 tablet (2.5 mg total) by mouth daily. Hold if systolic blood pressure is less than 100   terbinafine (LAMISIL) 250 MG tablet Take 1 tablet (250 mg total) by mouth daily.   trazodone (DESYREL) 300 MG tablet Take 300 mg by mouth at bedtime.     Allergies:   Oxycodone,  Metoprolol succinate [metoprolol], and Morphine   Social History   Socioeconomic History   Marital status: Widowed    Spouse name: Not on file   Number of children: 5   Years of education: Not on file   Highest education level: Not on file  Occupational History   Not on file  Tobacco Use   Smoking status: Some Days    Packs/day: .5    Types: Cigarettes   Smokeless tobacco: Never   Tobacco comments:    less than 1/2 pack daily  Vaping Use   Vaping Use: Never used  Substance and Sexual Activity   Alcohol use: No   Drug use: Not Currently    Types: Marijuana   Sexual activity: Not on file  Other Topics Concern   Not on file  Social History Narrative   Not on file   Social Determinants of Health   Financial Resource Strain: Not on file  Food Insecurity: Not on file  Transportation Needs: Not on file  Physical Activity: Not on file  Stress: Not on file  Social Connections: Not on file     Family History: The patient's family history includes Cancer in her brother, brother, and sister; Colon polyps (age of onset: 75) in her brother; Diabetes in her  brother; Heart failure in her mother. There is no history of Colon cancer, Esophageal cancer, Stomach cancer, or Rectal cancer. ROS:   Please see the history of present illness.    All other systems reviewed and are negative.  EKGs/Labs/Other Studies Reviewed:    The following studies were reviewed today:  Cardiac Studies & Procedures   CARDIAC CATHETERIZATION  CARDIAC CATHETERIZATION 03/13/2022  Narrative 1.  Mild obstructive coronary artery disease. 2.  Ventriculography consistent with recovering Takotsubo cardiomyopathy with an LVEDP of 19 mmHg.  Recommendation: Medical therapy and same-day discharge with close cardiology follow-up.  Findings Coronary Findings Diagnostic  Dominance: Right  Left Anterior Descending The vessel exhibits minimal luminal irregularities.  Right Coronary Artery The vessel exhibits minimal luminal irregularities.  Intervention  No interventions have been documented.   STRESS TESTS  MYOCARDIAL PERFUSION IMAGING 07/20/2019  Narrative  Nuclear stress EF: 55%.  The left ventricular ejection fraction is normal (55-65%).  There was no ST segment deviation noted during stress.  No T wave inversion was noted during stress.  Defect 1: There is a small fixed defect of mild severity present in the apical anterior and apex location, with normal wall motion.  The study is normal. The fixed defect described above is likely soft tissue attenuation.  This is a low risk study.   ECHOCARDIOGRAM  ECHOCARDIOGRAM COMPLETE 06/24/2022  Narrative ECHOCARDIOGRAM REPORT    Patient Name:   LAFERN BRINKLEY [facility] Date of Exam: 06/24/2022 Medical Rec #:  [id number]      Height:       62.0 in Accession #:    [id number]     Weight:       107.8 lb Date of Birth:  1956/04/17      BSA:          1.470 m Patient Age:    66 years       BP:           110/68 mmHg Patient Gender: F              HR:           87 bpm. Exam Location:  [facility]  Procedure: 2D Echo, Cardiac  Doppler, Color Doppler  and Strain Analysis  Indications:    Takotsubo cardiomyopathy [I51.81 (ICD-10-CM)]  History:        Patient has prior history of Echocardiogram examinations, most recent 03/11/2022. Stroke, Arrythmias:PVC, Signs/Symptoms:Fatigue and Precordial pain; Risk Factors:Diabetes and Dyslipidemia.  Sonographer:    Louie Boston RDCS Referring Phys: 22 TESSA N CONTE  IMPRESSIONS   1. Left ventricular ejection fraction, by estimation, is 60 to 65%. The left ventricle has normal function. The left ventricle has no regional wall motion abnormalities. Left ventricular diastolic parameters are consistent with Grade I diastolic dysfunction (impaired relaxation). GLS -10.1% 2. Right ventricular systolic function is normal. The right ventricular size is normal. There is normal pulmonary artery systolic pressure. 3. The mitral valve is normal in structure. No evidence of mitral valve regurgitation. No evidence of mitral stenosis. 4. The aortic valve is normal in structure. Aortic valve regurgitation is not visualized. No aortic stenosis is present. 5. The inferior vena cava is normal in size with greater than 50% respiratory variability, suggesting right atrial pressure of 3 mmHg.  FINDINGS Left Ventricle: Left ventricular ejection fraction, by estimation, is 60 to 65%. The left ventricle has normal function. The left ventricle has no regional wall motion abnormalities. The left ventricular internal cavity size was normal in size. There is no left ventricular hypertrophy. Left ventricular diastolic parameters are consistent with Grade I diastolic dysfunction (impaired relaxation).  Right Ventricle: The right ventricular size is normal. No increase in right ventricular wall thickness. Right ventricular systolic function is normal. There is normal pulmonary artery systolic pressure. The tricuspid regurgitant velocity is 2.12 m/s, and with an assumed right atrial pressure of 8 mmHg, the  estimated right ventricular systolic pressure is 26.0 mmHg.  Left Atrium: Left atrial size was normal in size.  Right Atrium: Right atrial size was normal in size.  Pericardium: There is no evidence of pericardial effusion.  Mitral Valve: The mitral valve is normal in structure. No evidence of mitral valve regurgitation. No evidence of mitral valve stenosis.  Tricuspid Valve: The tricuspid valve is normal in structure. Tricuspid valve regurgitation is not demonstrated. No evidence of tricuspid stenosis.  Aortic Valve: The aortic valve is normal in structure. Aortic valve regurgitation is not visualized. No aortic stenosis is present.  Pulmonic Valve: The pulmonic valve was normal in structure. Pulmonic valve regurgitation is not visualized. No evidence of pulmonic stenosis.  Aorta: The aortic root is normal in size and structure.  Venous: The inferior vena cava is normal in size with greater than 50% respiratory variability, suggesting right atrial pressure of 3 mmHg.  IAS/Shunts: No atrial level shunt detected by color flow Doppler.   LEFT VENTRICLE PLAX 2D LVIDd:         4.10 cm   Diastology LVIDs:         2.50 cm   LV e' medial:    4.87 cm/s LV PW:         1.00 cm   LV E/e' medial:  12.9 LV IVS:        1.00 cm   LV e' lateral:   6.00 cm/s LVOT diam:     2.00 cm   LV E/e' lateral: 10.4 LV SV:         47 LV SV Index:   32 LVOT Area:     3.14 cm   RIGHT VENTRICLE            IVC RV S prime:     8.11 cm/s  IVC diam:  2.10 cm TAPSE (M-mode): 1.9 cm  LEFT ATRIUM             Index        RIGHT ATRIUM          Index LA diam:        3.00 cm 2.04 cm/m   RA Area:     9.48 cm LA Vol (A2C):   34.2 ml 23.26 ml/m  RA Volume:   17.90 ml 12.18 ml/m LA Vol (A4C):   27.1 ml 18.43 ml/m LA Biplane Vol: 31.9 ml 21.70 ml/m AORTIC VALVE LVOT Vmax:   83.60 cm/s LVOT Vmean:  54.500 cm/s LVOT VTI:    0.149 m  AORTA Ao Root diam: 3.20 cm Ao Asc diam:  3.30 cm Ao Desc diam: 1.90  cm  MITRAL VALVE               TRICUSPID VALVE MV Area (PHT): 6.12 cm    TR Peak grad:   18.0 mmHg MV Decel Time: 124 msec    TR Vmax:        212.00 cm/s MV E velocity: 62.60 cm/s MV A velocity: 78.40 cm/s  SHUNTS MV E/A ratio:  0.80        Systemic VTI:  0.15 m Systemic Diam: 2.00 cm  Belva Crome MD Electronically signed by Belva Crome MD Signature Date/Time: 06/24/2022/4:39:03 PM    Final               Recent Labs: 03/13/2022: Potassium 3.7; Sodium 139 03/30/2022: Hemoglobin 14.9  Recent Lipid Panel No results found for: "CHOL", "TRIG", "HDL", "CHOLHDL", "VLDL", "LDLCALC", "LDLDIRECT"  Physical Exam:    VS:  BP 106/68 (BP Location: Left Arm, Patient Position: Sitting, Cuff Size: Small)   Pulse 88   Ht 5\' 2"  (1.575 m)   Wt 109 lb 12.8 oz (49.8 kg)   SpO2 96%   BMI 20.08 kg/m     Wt Readings from Last 3 Encounters:  09/01/22 109 lb 12.8 oz (49.8 kg)  03/30/22 107 lb 12.8 oz (48.9 kg)  03/13/22 106 lb 0.7 oz (48.1 kg)     GEN:  Well nourished, well developed in no acute distress HEENT: Normal NECK: No JVD; No carotid bruits LYMPHATICS: No lymphadenopathy CARDIAC: RRR, no murmurs, rubs, gallops RESPIRATORY:  Clear to auscultation without rales, wheezing or rhonchi  ABDOMEN: Soft, non-tender, non-distended MUSCULOSKELETAL:  No edema; No deformity  SKIN: Warm and dry NEUROLOGIC:  Alert and oriented x 3 PSYCHIATRIC:  Normal affect    Signed, Norman Herrlich, MD  09/01/2022 1:16 PM    [facility]

## 2022-09-01 ENCOUNTER — Ambulatory Visit (INDEPENDENT_AMBULATORY_CARE_PROVIDER_SITE_OTHER): Payer: Medicare HMO

## 2022-09-01 ENCOUNTER — Ambulatory Visit: Payer: Medicare HMO | Admitting: Podiatry

## 2022-09-01 ENCOUNTER — Ambulatory Visit: Payer: Medicare HMO | Attending: Cardiology | Admitting: Cardiology

## 2022-09-01 ENCOUNTER — Telehealth: Payer: Self-pay

## 2022-09-01 ENCOUNTER — Encounter: Payer: Self-pay | Admitting: Cardiology

## 2022-09-01 VITALS — BP 106/68 | HR 88 | Ht 62.0 in | Wt 109.8 lb

## 2022-09-01 DIAGNOSIS — I493 Ventricular premature depolarization: Secondary | ICD-10-CM | POA: Diagnosis not present

## 2022-09-01 DIAGNOSIS — L603 Nail dystrophy: Secondary | ICD-10-CM | POA: Diagnosis not present

## 2022-09-01 DIAGNOSIS — M2041 Other hammer toe(s) (acquired), right foot: Secondary | ICD-10-CM | POA: Diagnosis not present

## 2022-09-01 DIAGNOSIS — B351 Tinea unguium: Secondary | ICD-10-CM

## 2022-09-01 DIAGNOSIS — E782 Mixed hyperlipidemia: Secondary | ICD-10-CM | POA: Diagnosis not present

## 2022-09-01 DIAGNOSIS — I5181 Takotsubo syndrome: Secondary | ICD-10-CM | POA: Diagnosis not present

## 2022-09-01 DIAGNOSIS — M2042 Other hammer toe(s) (acquired), left foot: Secondary | ICD-10-CM

## 2022-09-01 MED ORDER — TERBINAFINE HCL 250 MG PO TABS: 250.0000 mg | ORAL_TABLET | Freq: Every day | ORAL | 2 refills | Status: AC

## 2022-09-01 MED ORDER — PRAVASTATIN SODIUM 20 MG PO TABS: 20.0000 mg | ORAL_TABLET | Freq: Every evening | ORAL | 3 refills | Status: DC

## 2022-09-01 NOTE — Telephone Encounter (Signed)
Received surgery paperwork from the [facility] office. Left a message for Kairee to call and schedule surgery with Dr. Annamary Rummage.

## 2022-09-01 NOTE — Progress Notes (Signed)
Subjective:  Patient ID: Nancy Chavez, female    DOB: 1956/05/01,  MRN: [id number]  Chief Complaint  Patient presents with   Hammer Toe    Hammertoe to right 2nd toe. Patient complain of pain when the toe rubs against the shoes.    Nail Problem    Nail fungus to left hallux. Painful at times. Patient has only tried OTC meds.     67 y.o. female presents with concern for pain to the right second toe as well as nail fungus to the left hallux nail.  She has tried over-the-counter nail fungus treatment but it was not effective.  She says the right second toe hurts as it rubs on the top of her shoe.  Past Medical History:  Diagnosis Date   Acquired spondylolisthesis 04/11/2012   Overview:  L4/5   Allergy    seasonal allergies   Anxiety 07/20/2017   on meds   Asymptomatic microscopic hematuria 12/09/2016   Backache 04/11/2012   Benzodiazepine dependence (HCC) 07/12/2017   Cancer (HCC)    Carpal tunnel syndrome of right wrist 09/03/2014   Cerebral artery occlusion with cerebral infarction (HCC) 08/10/2017   to stop smoking,  d/c coumadin,  begin asa qd   Cerebral artery occlusion with cerebral infarction (HCC) 2002/2016   Chronic back pain    Chronic radicular cervical pain 08/10/2017   followed by neurology   Colitis 08/10/2017   continue meds per ER   Cyst of thyroid 08/10/2017   Diverticulosis 08/10/2017   Drug withdrawal syndrome (HCC) 07/12/2017   Dysphagia 05/19/2017   Elevated glucose 08/10/2017   Epigastric pain 07/29/2017   Gastroesophageal reflux disease without esophagitis 08/10/2017   diet controlled as of 05/27/2020   Hiatal hernia 08/10/2017   History of colon polyps 08/10/2017   History of CVA (cerebrovascular accident) 12/24/2014   Formatting of this note might be different from the original. 2006   Hormone replacement therapy (HRT) 06/17/2017   Hx-TIA (transient ischemic attack) 12/24/2014   Hypercalcemia 08/10/2017   Insomnia 07/20/2017   Low thyroid stimulating hormone (TSH) level  01/20/2018   Lumbar radiculopathy, chronic 08/10/2017   Formatting of this note might be different from the original. followed by neurology   Menopause 08/10/2017   script for prevo   Mixed dyslipidemia 08/10/2017   at goal,  continue low fat low cholesterol diet.   Mixed emotional features as adjustment reaction 08/10/2017   Mixed hyperlipidemia 08/10/2017   on meds   Neck pain 08/10/2017   Prednisone 20mg  2 qd x7d and Lortab 5mg  q4hrs prn #40. If pain persists or worsens may need MRI.   Osteoporosis 2021   confirmed by bone density testing   Pain in joint, pelvic region and thigh 04/11/2012   Palpitations 06/21/2019   Precordial pain 06/21/2019   Primary osteoarthritis of first carpometacarpal joint of right hand 09/03/2014   generalized arthritis   PVC (premature ventricular contraction) 06/21/2019   Recurrent nephrolithiasis 12/13/2016   Renal cyst 12/13/2016   S/P lumbar fusion 05/13/2016   Seizures (HCC) 2018   Shortness of breath 06/21/2019   Situational mixed anxiety and depressive disorder 08/10/2017   on meds   Spinal stenosis of lumbar region without neurogenic claudication 04/11/2012   Overview:  L4/5   Squamous cell carcinoma of right lower leg    Stress incontinence, female 08/10/2017   Thoracic or lumbosacral neuritis or radiculitis 04/11/2012   Overview:  L4-S1 proven by EMG ICD-10 cut over    Tobacco abuse 08/10/2017  stopped smoking 8 weeks ago-  currently taking chantix   Transient ischemic attack (TIA)    Weight loss, unintentional 01/20/2018    Allergies  Allergen Reactions   Oxycodone Nausea And Vomiting   Metoprolol Succinate [Metoprolol] Rash    Raised, red whelps   Morphine Rash    ROS: Negative except as per HPI above  Objective:  General: AAO x3, NAD  Dermatological: Discoloration of the bilateral hallux nail left worse than right.  There is thickening dystrophy and subungual debris at the left hallux nail consistent with fungal infection  Vascular:  Dorsalis Pedis  artery and Posterior Tibial artery pedal pulses are 2/4 bilateral.  Capillary fill time < 3 sec to all digits.   Neruologic: Grossly intact via light touch bilateral. Protective threshold intact to all sites bilateral.   Musculoskeletal: Of the right second toe there is noted to be edema at the proximal interphalangeal joint and pain associated with the second toe.   There is rigid hammertoe deformity with flexion contracture of the proximal interphalangeal joint and slight extension at the metatarsal phalangeal joint.  Pain with palpation of the toe.  Gait: Unassisted, Nonantalgic.   No images are attached to the encounter.  Radiographs:  Date: 09/01/2022 XR the right foot Weightbearing AP/Lateral/Oblique   Findings: Prior postoperative changes in both the fifth metatarsal and first metatarsal consistent with prior bunionectomy and tailor's bunionectomy.  The second toe there is no to be hammertoe deformity seen on lateral view with flexion contracture of the proximal phalangeal joint and extension contracture at the MPJ.  There is mild arthritic changes at the proximal interphalangeal joint with loss of joint space at this location on AP view. Assessment:   1. Hammertoe of second toe of right foot   2. Hammertoes of both feet   3. Nail dystrophy   4. Onychomycosis      Plan:  Patient was evaluated and treated and all questions answered.  # Hammertoe second toe right foot -Discussed with the patient in detail regarding the hammertoe deformity in the second toe.  She is having pain with this and wanted addressed -Discussed conservative or surgical treatment options with her in detail. -Conservative treatment would include gel toe cap or spacer -Surgically I recommend proximal interphalangeal arthroplasty with implant arthrodesis of the PIPJ. -Discussed the risk benefits alternatives and possible complications of such procedure. -Discussed the expected postoperative recovery course in  detail with the patient -Patient would like to proceed with surgical intervention we will begin surgical planning at this time  # Onychomycosis of the hallux nail left foot worse than right Onychomycosis -Educated on etiology of nail fungus. -Nail sample taken for microbiology and histology. -Baseline liver function studies ordered. Will d/c terbinafine if elevated during therapy. -eRx for oral terbinafine 250 mg take once daily for the next 90 days #30 with 2 refill. Educated on risks and benefits of the medication including risk of hepatic toxicity.         Corinna Gab, DPM [facility] / [facility]

## 2022-09-01 NOTE — Patient Instructions (Signed)
Medication Instructions:  Your physician has recommended you make the following change in your medication:   STOP: Lisinopril START: Pravastatin 20 mg daily  *If you need a refill on your cardiac medications before your next appointment, please call your pharmacy*   Lab Work: Your physician recommends that you return for lab work in:   Labs in 6 weeks: CMP, Lipids  If you have labs (blood work) drawn today and your tests are completely normal, you will receive your results only by: MyChart Message (if you have MyChart) OR A paper copy in the mail If you have any lab test that is abnormal or we need to change your treatment, we will call you to review the results.   Testing/Procedures: None   Follow-Up: At [facility], you and your health needs are our priority.  As part of our continuing mission to provide you with exceptional heart care, we have created designated Provider Care Teams.  These Care Teams include your primary Cardiologist (physician) and Advanced Practice Providers (APPs -  Physician Assistants and Nurse Practitioners) who all work together to provide you with the care you need, when you need it.  We recommend signing up for the patient portal called "MyChart".  Sign up information is provided on this After Visit Summary.  MyChart is used to connect with patients for Virtual Visits (Telemedicine).  Patients are able to view lab/test results, encounter notes, upcoming appointments, etc.  Non-urgent messages can be sent to your provider as well.   To learn more about what you can do with MyChart, go to [email or web address].    Your next appointment:   1 year(s)  Provider:   Norman Herrlich, MD    Other Instructions None

## 2022-09-02 LAB — HEPATIC FUNCTION PANEL
ALT: 27 IU/L (ref 0–32)
AST: 21 IU/L (ref 0–40)
Albumin: 4.1 g/dL (ref 3.9–4.9)
Alkaline Phosphatase: 114 IU/L (ref 44–121)
Bilirubin Total: 0.2 mg/dL (ref 0.0–1.2)
Bilirubin, Direct: <0.1 mg/dL (ref 0.00–0.40)
Total Protein: 6.5 g/dL (ref 6.0–8.5)

## 2022-09-04 ENCOUNTER — Other Ambulatory Visit: Payer: Self-pay | Admitting: *Deleted

## 2022-09-04 DIAGNOSIS — Z8673 Personal history of transient ischemic attack (TIA), and cerebral infarction without residual deficits: Secondary | ICD-10-CM

## 2022-09-10 ENCOUNTER — Ambulatory Visit (HOSPITAL_COMMUNITY): Payer: Medicare HMO

## 2022-09-10 ENCOUNTER — Ambulatory Visit: Payer: Medicare HMO | Admitting: Vascular Surgery

## 2022-09-17 DIAGNOSIS — Z79899 Other long term (current) drug therapy: Secondary | ICD-10-CM | POA: Diagnosis not present

## 2022-09-17 DIAGNOSIS — R251 Tremor, unspecified: Secondary | ICD-10-CM | POA: Diagnosis not present

## 2022-09-17 DIAGNOSIS — G2581 Restless legs syndrome: Secondary | ICD-10-CM | POA: Diagnosis not present

## 2022-09-17 DIAGNOSIS — M7912 Myalgia of auxiliary muscles, head and neck: Secondary | ICD-10-CM | POA: Diagnosis not present

## 2022-09-17 DIAGNOSIS — M7918 Myalgia, other site: Secondary | ICD-10-CM | POA: Diagnosis not present

## 2022-09-30 ENCOUNTER — Telehealth: Payer: Self-pay | Admitting: Podiatry

## 2022-09-30 NOTE — Telephone Encounter (Signed)
Patient left VM on Rn line stating that she was put on an "antibiotic" for nail fungus and then she got a message stating that she needs to be on the topical medication as well but she was not told to be on a topical and never received one. Please contact patient.

## 2022-10-05 ENCOUNTER — Other Ambulatory Visit: Payer: Self-pay | Admitting: Podiatry

## 2022-10-05 MED ORDER — CICLOPIROX 8 % EX SOLN: Freq: Every day | CUTANEOUS | 0 refills | Status: DC

## 2022-10-05 NOTE — Progress Notes (Signed)
Erx for penlac sent

## 2022-10-29 ENCOUNTER — Encounter: Payer: Self-pay | Admitting: Vascular Surgery

## 2022-10-29 ENCOUNTER — Ambulatory Visit: Payer: Medicare HMO | Admitting: Vascular Surgery

## 2022-10-29 ENCOUNTER — Ambulatory Visit (HOSPITAL_COMMUNITY)
Admission: RE | Admit: 2022-10-29 | Discharge: 2022-10-29 | Disposition: A | Discharge status: Home / Self Care | Payer: Medicare HMO | Source: Ambulatory Visit | Attending: Vascular Surgery | Admitting: Vascular Surgery

## 2022-10-29 VITALS — BP 119/74 | HR 74 | Temp 97.9°F | Resp 20 | Ht 62.0 in | Wt 106.0 lb

## 2022-10-29 DIAGNOSIS — I6521 Occlusion and stenosis of right carotid artery: Secondary | ICD-10-CM | POA: Diagnosis not present

## 2022-10-29 DIAGNOSIS — Z8673 Personal history of transient ischemic attack (TIA), and cerebral infarction without residual deficits: Secondary | ICD-10-CM | POA: Insufficient documentation

## 2022-10-29 NOTE — Progress Notes (Signed)
REASON FOR VISIT:   Follow-up of carotid disease.  MEDICAL ISSUES:   RIGHT CAROTID PLAQUE: This patient has a calcific plaque in the distal right common carotid artery.  It is currently not producing any significant stenosis.  She has no significant disease on the left side.  I think 2-year follow-up is reasonable and have ordered a follow-up carotid duplex scan in 2 years.  I explained that I will be retiring and she will be seen on the PA schedule at that time.  We have again discussed the importance of tobacco cessation.  She is on aspirin and is on a statin.   HPI:   Nancy Chavez is a pleasant 67 y.o. female who I last saw on 01/24/2020.  Lites seen back in 2021 with a mild right carotid stenosis.  She presented to the emergency department in March with slurred speech and ataxia.  The stenosis was seen on CT.  When I saw her last she had no significant stenosis on either side although she did have some calcific plaque in the distal right common carotid artery.  When I saw her last she was on aspirin and a statin.  We discussed the importance of tobacco cessation.  I recommended a follow-up to duplex scan in 2 years and she comes in for that visit.  Since I saw her last, she denies any history of stroke, TIAs, expressive or receptive aphasia, or amaurosis fugax.  She is on aspirin and is on a statin.  She smokes about a third of a pack cigarettes per day.  She lost her husband a year ago yesterday.  Her children are not close by.  Past Medical History:  Diagnosis Date   Acquired spondylolisthesis 04/11/2012   Overview:  L4/5   Allergy    seasonal allergies   Anxiety 07/20/2017   on meds   Asymptomatic microscopic hematuria 12/09/2016   Backache 04/11/2012   Benzodiazepine dependence (HCC) 07/12/2017   Cancer (HCC)    Carpal tunnel syndrome of right wrist 09/03/2014   Cerebral artery occlusion with cerebral infarction (HCC) 08/10/2017   to stop smoking,  d/c coumadin,  begin asa qd    Cerebral artery occlusion with cerebral infarction (HCC) 2002/2016   Chronic back pain    Chronic radicular cervical pain 08/10/2017   followed by neurology   Colitis 08/10/2017   continue meds per ER   Cyst of thyroid 08/10/2017   Diverticulosis 08/10/2017   Drug withdrawal syndrome (HCC) 07/12/2017   Dysphagia 05/19/2017   Elevated glucose 08/10/2017   Epigastric pain 07/29/2017   Gastroesophageal reflux disease without esophagitis 08/10/2017   diet controlled as of 05/27/2020   Hiatal hernia 08/10/2017   History of colon polyps 08/10/2017   History of CVA (cerebrovascular accident) 12/24/2014   Formatting of this note might be different from the original. 2006   Hormone replacement therapy (HRT) 06/17/2017   Hx-TIA (transient ischemic attack) 12/24/2014   Hypercalcemia 08/10/2017   Insomnia 07/20/2017   Low thyroid stimulating hormone (TSH) level 01/20/2018   Lumbar radiculopathy, chronic 08/10/2017   Formatting of this note might be different from the original. followed by neurology   Menopause 08/10/2017   script for prevo   Mixed dyslipidemia 08/10/2017   at goal,  continue low fat low cholesterol diet.   Mixed emotional features as adjustment reaction 08/10/2017   Mixed hyperlipidemia 08/10/2017   on meds   Neck pain 08/10/2017   Prednisone 20mg  2 qd x7d and Lortab 5mg   q4hrs prn #40. If pain persists or worsens may need MRI.   Osteoporosis 2021   confirmed by bone density testing   Pain in joint, pelvic region and thigh 04/11/2012   Palpitations 06/21/2019   Precordial pain 06/21/2019   Primary osteoarthritis of first carpometacarpal joint of right hand 09/03/2014   generalized arthritis   PVC (premature ventricular contraction) 06/21/2019   Recurrent nephrolithiasis 12/13/2016   Renal cyst 12/13/2016   S/P lumbar fusion 05/13/2016   Seizures (HCC) 2018   Shortness of breath 06/21/2019   Situational mixed anxiety and depressive disorder 08/10/2017   on meds   Spinal stenosis of lumbar region without neurogenic  claudication 04/11/2012   Overview:  L4/5   Squamous cell carcinoma of right lower leg    Stress incontinence, female 08/10/2017   Thoracic or lumbosacral neuritis or radiculitis 04/11/2012   Overview:  L4-S1 proven by EMG ICD-10 cut over    Tobacco abuse 08/10/2017   stopped smoking 8 weeks ago-  currently taking chantix   Transient ischemic attack (TIA)    Weight loss, unintentional 01/20/2018    Family History  Problem Relation Age of Onset   Heart failure Mother    Cancer Sister    Diabetes Brother    Cancer Brother    Colon polyps Brother 34   Cancer Brother    Colon cancer Neg Hx    Esophageal cancer Neg Hx    Stomach cancer Neg Hx    Rectal cancer Neg Hx     SOCIAL HISTORY: Social History   Tobacco Use   Smoking status: Every Day    Packs/day: .5    Types: Cigarettes   Smokeless tobacco: Never   Tobacco comments:    less than 1/2 pack daily  Substance Use Topics   Alcohol use: No    Allergies  Allergen Reactions   Oxycodone Nausea And Vomiting   Metoprolol Succinate [Metoprolol] Rash    Raised, red whelps   Morphine Rash    Current Outpatient Medications  Medication Sig Dispense Refill   amitriptyline (ELAVIL) 50 MG tablet Take 50 mg by mouth at bedtime.     aspirin EC 81 MG tablet Take 81 mg by mouth daily. Swallow whole.     buPROPion (WELLBUTRIN XL) 150 MG 24 hr tablet Take 150 mg by mouth daily.     carvedilol (COREG) 6.25 MG tablet Take 1 tablet (6.25 mg total) by mouth 2 (two) times daily. Hold if systolic blood pressure is less than 100 180 tablet 3   ciclopirox (PENLAC) 8 % solution Apply topically at bedtime. Apply over nail and surrounding skin. Apply daily over previous coat. After seven (7) days, may remove with alcohol and continue cycle. 6.6 mL 0   diazepam (VALIUM) 5 MG tablet Take 2.5 mg by mouth every 8 (eight) hours as needed for anxiety.     estradiol (ESTRACE) 1 MG tablet Take 1 mg by mouth daily.     gabapentin (NEURONTIN) 300 MG capsule  Take 600-1,200 mg by mouth See admin instructions. Take 600 mg (2 capsules) by mouth every morning and 1,200 mg (4 capsules) at bedtime.     HYDROcodone-acetaminophen (NORCO) 10-325 MG tablet Take 1.5 tablets by mouth every 8 (eight) hours as needed for moderate pain.     pravastatin (PRAVACHOL) 20 MG tablet Take 1 tablet (20 mg total) by mouth every evening. 90 tablet 3   terbinafine (LAMISIL) 250 MG tablet Take 1 tablet (250 mg total) by mouth daily. 30  tablet 2   trazodone (DESYREL) 300 MG tablet Take 300 mg by mouth at bedtime.     No current facility-administered medications for this visit.    REVIEW OF SYSTEMS:  [X]  denotes positive finding, [ ]  denotes negative finding Cardiac  Comments:  Chest pain or chest pressure:    Shortness of breath upon exertion:    Short of breath when lying flat:    Irregular heart rhythm:        Vascular    Pain in calf, thigh, or hip brought on by ambulation:    Pain in feet at night that wakes you up from your sleep:     Blood clot in your veins:    Leg swelling:         Pulmonary    Oxygen at home:    Productive cough:     Wheezing:         Neurologic    Sudden weakness in arms or legs:     Sudden numbness in arms or legs:     Sudden onset of difficulty speaking or slurred speech:    Temporary loss of vision in one eye:     Problems with dizziness:  x       Gastrointestinal    Blood in stool:     Vomited blood:         Genitourinary    Burning when urinating:     Blood in urine:        Psychiatric    Major depression:         Hematologic    Bleeding problems:    Problems with blood clotting too easily:        Skin    Rashes or ulcers:        Constitutional    Fever or chills:     PHYSICAL EXAM:   Vitals:   10/29/22 0817 10/29/22 0819  BP: 111/73 119/74  Pulse: 74   Resp: 20   Temp: 97.9 F (36.6 C)   SpO2: 94%   Weight: 106 lb (48.1 kg)   Height: 5\' 2"  (1.575 m)     GENERAL: The patient is a well-nourished  female, in no acute distress. The vital signs are documented above. CARDIAC: There is a regular rate and rhythm.  VASCULAR: I do not detect carotid bruits. She has palpable femoral pulses bilaterally. Has a palpable right dorsalis pedis and posterior tibial pulse. She has a palpable left posterior tibial pulse. PULMONARY: There is good air exchange bilaterally without wheezing or rales. ABDOMEN: Soft and non-tender with normal pitched bowel sounds.  MUSCULOSKELETAL: There are no major deformities or cyanosis. NEUROLOGIC: No focal weakness or paresthesias are detected. SKIN: There are no ulcers or rashes noted. PSYCHIATRIC: The patient has a normal affect.  DATA:    CAROTID DUPLEX: I have independently interpreted her carotid duplex scan today.  On the right side there is some calcific plaque in the distal common carotid artery.  There is no significant stenosis noted on the right.  The right vertebral artery is patent with antegrade flow.  On the left side, there is no significant stenosis.  The left vertebral artery is patent with antegrade flow.  Waverly Ferrari [facility] [phone]

## 2022-12-03 ENCOUNTER — Telehealth: Payer: Self-pay | Admitting: Podiatry

## 2022-12-03 ENCOUNTER — Other Ambulatory Visit: Payer: Self-pay | Admitting: Podiatry

## 2022-12-03 MED ORDER — CICLOPIROX 8 % EX SOLN: Freq: Every day | CUTANEOUS | 0 refills | Status: DC

## 2022-12-03 NOTE — Progress Notes (Signed)
Refill of penlac sent

## 2022-12-03 NOTE — Telephone Encounter (Signed)
Pt called requesting a Refill on her two Rx.  Please advise.

## 2022-12-03 NOTE — Telephone Encounter (Signed)
Pt needing RX refill Lamisil.  Please advise

## 2023-02-12 DIAGNOSIS — M189 Osteoarthritis of first carpometacarpal joint, unspecified: Secondary | ICD-10-CM | POA: Diagnosis not present

## 2023-02-12 DIAGNOSIS — M1812 Unilateral primary osteoarthritis of first carpometacarpal joint, left hand: Secondary | ICD-10-CM | POA: Diagnosis not present

## 2023-02-16 DIAGNOSIS — M7918 Myalgia, other site: Secondary | ICD-10-CM | POA: Diagnosis not present

## 2023-02-16 DIAGNOSIS — R251 Tremor, unspecified: Secondary | ICD-10-CM | POA: Diagnosis not present

## 2023-02-16 DIAGNOSIS — Z760 Encounter for issue of repeat prescription: Secondary | ICD-10-CM | POA: Diagnosis not present

## 2023-02-16 DIAGNOSIS — G43709 Chronic migraine without aura, not intractable, without status migrainosus: Secondary | ICD-10-CM | POA: Diagnosis not present

## 2023-02-16 DIAGNOSIS — Z79899 Other long term (current) drug therapy: Secondary | ICD-10-CM | POA: Diagnosis not present

## 2023-02-16 DIAGNOSIS — G2581 Restless legs syndrome: Secondary | ICD-10-CM | POA: Diagnosis not present

## 2023-02-16 DIAGNOSIS — M7912 Myalgia of auxiliary muscles, head and neck: Secondary | ICD-10-CM | POA: Diagnosis not present

## 2023-03-25 DIAGNOSIS — G894 Chronic pain syndrome: Secondary | ICD-10-CM | POA: Diagnosis not present

## 2023-03-25 DIAGNOSIS — R251 Tremor, unspecified: Secondary | ICD-10-CM | POA: Diagnosis not present

## 2023-03-25 DIAGNOSIS — M7912 Myalgia of auxiliary muscles, head and neck: Secondary | ICD-10-CM | POA: Diagnosis not present

## 2023-03-25 DIAGNOSIS — M7918 Myalgia, other site: Secondary | ICD-10-CM | POA: Diagnosis not present

## 2023-03-25 DIAGNOSIS — G2581 Restless legs syndrome: Secondary | ICD-10-CM | POA: Diagnosis not present

## 2023-03-25 DIAGNOSIS — G43709 Chronic migraine without aura, not intractable, without status migrainosus: Secondary | ICD-10-CM | POA: Diagnosis not present

## 2023-03-25 DIAGNOSIS — Z79899 Other long term (current) drug therapy: Secondary | ICD-10-CM | POA: Diagnosis not present

## 2023-03-25 DIAGNOSIS — Z760 Encounter for issue of repeat prescription: Secondary | ICD-10-CM | POA: Diagnosis not present

## 2023-04-13 ENCOUNTER — Other Ambulatory Visit: Payer: Self-pay | Admitting: Physician Assistant

## 2023-05-05 ENCOUNTER — Other Ambulatory Visit: Payer: Self-pay | Admitting: Physician Assistant

## 2023-05-06 NOTE — Telephone Encounter (Signed)
 This is an Event organiser

## 2023-07-01 DIAGNOSIS — M7918 Myalgia, other site: Secondary | ICD-10-CM | POA: Diagnosis not present

## 2023-07-01 DIAGNOSIS — G894 Chronic pain syndrome: Secondary | ICD-10-CM | POA: Diagnosis not present

## 2023-07-01 DIAGNOSIS — M7912 Myalgia of auxiliary muscles, head and neck: Secondary | ICD-10-CM | POA: Diagnosis not present

## 2023-07-01 DIAGNOSIS — G43709 Chronic migraine without aura, not intractable, without status migrainosus: Secondary | ICD-10-CM | POA: Diagnosis not present

## 2023-07-01 DIAGNOSIS — Z79899 Other long term (current) drug therapy: Secondary | ICD-10-CM | POA: Diagnosis not present

## 2023-07-01 DIAGNOSIS — Z760 Encounter for issue of repeat prescription: Secondary | ICD-10-CM | POA: Diagnosis not present

## 2023-07-01 DIAGNOSIS — G2581 Restless legs syndrome: Secondary | ICD-10-CM | POA: Diagnosis not present

## 2023-07-01 DIAGNOSIS — R251 Tremor, unspecified: Secondary | ICD-10-CM | POA: Diagnosis not present

## 2023-07-06 DIAGNOSIS — M810 Age-related osteoporosis without current pathological fracture: Secondary | ICD-10-CM | POA: Diagnosis not present

## 2023-07-06 DIAGNOSIS — Z6820 Body mass index (BMI) 20.0-20.9, adult: Secondary | ICD-10-CM | POA: Diagnosis not present

## 2023-07-06 DIAGNOSIS — M25532 Pain in left wrist: Secondary | ICD-10-CM | POA: Diagnosis not present

## 2023-07-06 DIAGNOSIS — E559 Vitamin D deficiency, unspecified: Secondary | ICD-10-CM | POA: Diagnosis not present

## 2023-07-28 DIAGNOSIS — G894 Chronic pain syndrome: Secondary | ICD-10-CM | POA: Diagnosis not present

## 2023-07-28 DIAGNOSIS — G2581 Restless legs syndrome: Secondary | ICD-10-CM | POA: Diagnosis not present

## 2023-07-28 DIAGNOSIS — Z760 Encounter for issue of repeat prescription: Secondary | ICD-10-CM | POA: Diagnosis not present

## 2023-07-28 DIAGNOSIS — R251 Tremor, unspecified: Secondary | ICD-10-CM | POA: Diagnosis not present

## 2023-07-28 DIAGNOSIS — M7912 Myalgia of auxiliary muscles, head and neck: Secondary | ICD-10-CM | POA: Diagnosis not present

## 2023-07-28 DIAGNOSIS — M7918 Myalgia, other site: Secondary | ICD-10-CM | POA: Diagnosis not present

## 2023-07-28 DIAGNOSIS — Z79899 Other long term (current) drug therapy: Secondary | ICD-10-CM | POA: Diagnosis not present

## 2023-07-28 DIAGNOSIS — G43709 Chronic migraine without aura, not intractable, without status migrainosus: Secondary | ICD-10-CM | POA: Diagnosis not present

## 2023-08-17 DIAGNOSIS — K2100 Gastro-esophageal reflux disease with esophagitis, without bleeding: Secondary | ICD-10-CM | POA: Diagnosis not present

## 2023-08-17 DIAGNOSIS — Z122 Encounter for screening for malignant neoplasm of respiratory organs: Secondary | ICD-10-CM | POA: Diagnosis not present

## 2023-08-17 DIAGNOSIS — S8992XA Unspecified injury of left lower leg, initial encounter: Secondary | ICD-10-CM | POA: Diagnosis not present

## 2023-08-17 DIAGNOSIS — Z79899 Other long term (current) drug therapy: Secondary | ICD-10-CM | POA: Diagnosis not present

## 2023-08-17 DIAGNOSIS — Z0000 Encounter for general adult medical examination without abnormal findings: Secondary | ICD-10-CM | POA: Diagnosis not present

## 2023-08-17 DIAGNOSIS — R7302 Impaired glucose tolerance (oral): Secondary | ICD-10-CM | POA: Diagnosis not present

## 2023-08-17 DIAGNOSIS — Z6820 Body mass index (BMI) 20.0-20.9, adult: Secondary | ICD-10-CM | POA: Diagnosis not present

## 2023-08-17 DIAGNOSIS — M810 Age-related osteoporosis without current pathological fracture: Secondary | ICD-10-CM | POA: Diagnosis not present

## 2023-08-17 DIAGNOSIS — Z1339 Encounter for screening examination for other mental health and behavioral disorders: Secondary | ICD-10-CM | POA: Diagnosis not present

## 2023-08-17 LAB — LAB REPORT - SCANNED: A1c: 5.6

## 2023-08-23 DIAGNOSIS — R918 Other nonspecific abnormal finding of lung field: Secondary | ICD-10-CM | POA: Diagnosis not present

## 2023-08-23 DIAGNOSIS — Z122 Encounter for screening for malignant neoplasm of respiratory organs: Secondary | ICD-10-CM | POA: Diagnosis not present

## 2023-08-23 DIAGNOSIS — Z87891 Personal history of nicotine dependence: Secondary | ICD-10-CM | POA: Diagnosis not present

## 2023-08-25 DIAGNOSIS — Z87891 Personal history of nicotine dependence: Secondary | ICD-10-CM | POA: Diagnosis not present

## 2023-08-26 DIAGNOSIS — R251 Tremor, unspecified: Secondary | ICD-10-CM | POA: Diagnosis not present

## 2023-08-26 DIAGNOSIS — Z760 Encounter for issue of repeat prescription: Secondary | ICD-10-CM | POA: Diagnosis not present

## 2023-08-26 DIAGNOSIS — M7912 Myalgia of auxiliary muscles, head and neck: Secondary | ICD-10-CM | POA: Diagnosis not present

## 2023-08-26 DIAGNOSIS — G894 Chronic pain syndrome: Secondary | ICD-10-CM | POA: Diagnosis not present

## 2023-08-26 DIAGNOSIS — G2581 Restless legs syndrome: Secondary | ICD-10-CM | POA: Diagnosis not present

## 2023-08-26 DIAGNOSIS — M7918 Myalgia, other site: Secondary | ICD-10-CM | POA: Diagnosis not present

## 2023-08-26 DIAGNOSIS — Z79899 Other long term (current) drug therapy: Secondary | ICD-10-CM | POA: Diagnosis not present

## 2023-08-26 DIAGNOSIS — G43709 Chronic migraine without aura, not intractable, without status migrainosus: Secondary | ICD-10-CM | POA: Diagnosis not present

## 2023-09-28 NOTE — Telephone Encounter (Signed)
 Called patient as a secondary reminder to bring cd and report to visit tomorrow with Sarah Groce

## 2023-09-29 ENCOUNTER — Ambulatory Visit: Admitting: Acute Care

## 2023-09-29 ENCOUNTER — Encounter: Payer: Self-pay | Admitting: Acute Care

## 2023-09-29 VITALS — BP 104/67 | HR 65 | Ht 61.0 in | Wt 110.2 lb

## 2023-09-29 DIAGNOSIS — J439 Emphysema, unspecified: Secondary | ICD-10-CM

## 2023-09-29 DIAGNOSIS — R918 Other nonspecific abnormal finding of lung field: Secondary | ICD-10-CM

## 2023-09-29 DIAGNOSIS — F17200 Nicotine dependence, unspecified, uncomplicated: Secondary | ICD-10-CM | POA: Diagnosis not present

## 2023-09-29 NOTE — Progress Notes (Addendum)
 History of Present Illness Nancy Chavez is a 68 y.o. female recently former smoker( quit 09/29/2023 , now vaping) referred by Dr. Delon Contes 09/2023 for abnormal lung cancer screening. She will be followed by Dr. Shelah.   Pt. Has consented to use of [employer] soft wear to help capture the content of this OV.  09/29/2023 Pt. Presents for consult after abnormal lung cancer screening scan.  Nancy Chavez is a 68 year old female with mild emphysema who presents for evaluation of small lung nodules.  Small lung nodules were identified on a recent lung cancer screening scan performed on August 25, 2023. These nodules are very small, round, and were not present on a CT angiogram from March 10, 2022. Due to their size, they are not suitable for evaluation with a PET scan.While these nodules were described as new, radiology feel they were not visible on her previous CTA for technical reasons as the current LDCT examination is much higher quality for detecting small nodules.   She has a history of mild emphysema with small apical scarring, attributed to past smoking. She experiences shortness of breath on exertion, which has been present for about a year. She is not currently using inhalers and has not undergone pulmonary function testing. No new exposures or changes in her breathing since 2023.  Her family history is significant for her father having emphysema. She is currently disabled and previously worked as an Nature conservation officer for [employer], with no significant environmental exposures reported.  Test Results: LDCT 08/2023 Central airways are patent. Mild emphysema. Small apical scarring. No consolidation, effusion or pneumothorax.  On the left, intrapulmonary lymph node. Series 301, image 134, 3 mm noncalcified upper lobe nodule not definitely seen previously. Series 602, image 280, 3.5 mm noncalcified lingular segment nodule not definitely seen previously.  On the right, series 301, image 158, 2  mm noncalcified right middle lobe nodule, not definitely seen previously.  Stable base of neck and axilla. Cervical spine surgery. Thoracic spine degeneration. Normal esophagus. Normal heart size. No acute vascular pathology on noncontrast scanning. No acute chest wall findings. Status post cholecystectomy. No acute upper abdominal findings.  IMPRESSION: Bilateral, small uncalcified nodules not definitely seen previously. This might be for technical reasons as the current examination is much higher quality for detecting small nodules.      Latest Ref Rng & Units 03/30/2022    9:54 AM 03/13/2022    5:00 PM 03/13/2022    4:58 PM  CBC  Hemoglobin 11.1 - 15.9 g/dL 85.0  86.0  86.0   Hematocrit 36.0 - 46.0 %  41.0  41.0        Latest Ref Rng & Units 03/13/2022    5:00 PM 03/13/2022    4:58 PM 03/13/2022    4:51 PM  BMP  Sodium 135 - 145 mmol/L 139  139  138   Potassium 3.5 - 5.1 mmol/L 3.7  3.7  3.7     BNP No results found for: BNP  ProBNP No results found for: PROBNP  PFT No results found for: FEV1PRE, FEV1POST, FVCPRE, FVCPOST, TLC, DLCOUNC, PREFEV1FVCRT, PSTFEV1FVCRT  No results found.   Past medical hx Past Medical History:  Diagnosis Date   Acquired spondylolisthesis 04/11/2012   Overview:  L4/5   Allergy    seasonal allergies   Anxiety 07/20/2017   on meds   Asymptomatic microscopic hematuria 12/09/2016   Backache 04/11/2012   Benzodiazepine dependence (HCC) 07/12/2017   Cancer (HCC)    Carpal  tunnel syndrome of right wrist 09/03/2014   Cerebral artery occlusion with cerebral infarction (HCC) 08/10/2017   to stop smoking,  d/c coumadin,  begin asa qd   Cerebral artery occlusion with cerebral infarction (HCC) 2002/2016   Chronic back pain    Chronic radicular cervical pain 08/10/2017   followed by neurology   Colitis 08/10/2017   continue meds per ER   Cyst of thyroid  08/10/2017   Diverticulosis 08/10/2017   Drug withdrawal syndrome (HCC) 07/12/2017    Dysphagia 05/19/2017   Elevated glucose 08/10/2017   Epigastric pain 07/29/2017   Gastroesophageal reflux disease without esophagitis 08/10/2017   diet controlled as of 05/27/2020   Hiatal hernia 08/10/2017   History of colon polyps 08/10/2017   History of CVA (cerebrovascular accident) 12/24/2014   Formatting of this note might be different from the original. 2006   Hormone replacement therapy (HRT) 06/17/2017   Hx-TIA (transient ischemic attack) 12/24/2014   Hypercalcemia 08/10/2017   Insomnia 07/20/2017   Low thyroid  stimulating hormone (TSH) level 01/20/2018   Lumbar radiculopathy, chronic 08/10/2017   Formatting of this note might be different from the original. followed by neurology   Menopause 08/10/2017   script for prevo   Mixed dyslipidemia 08/10/2017   at goal,  continue low fat low cholesterol diet.   Mixed emotional features as adjustment reaction 08/10/2017   Mixed hyperlipidemia 08/10/2017   on meds   Neck pain 08/10/2017   Prednisone 20mg  2 qd x7d and Lortab 5mg  q4hrs prn #40. If pain persists or worsens may need MRI.   Osteoporosis 2021   confirmed by bone density testing   Pain in joint, pelvic region and thigh 04/11/2012   Palpitations 06/21/2019   Precordial pain 06/21/2019   Primary osteoarthritis of first carpometacarpal joint of right hand 09/03/2014   generalized arthritis   PVC (premature ventricular contraction) 06/21/2019   Recurrent nephrolithiasis 12/13/2016   Renal cyst 12/13/2016   S/P lumbar fusion 05/13/2016   Seizures (HCC) 2018   Shortness of breath 06/21/2019   Situational mixed anxiety and depressive disorder 08/10/2017   on meds   Spinal stenosis of lumbar region without neurogenic claudication 04/11/2012   Overview:  L4/5   Squamous cell carcinoma of right lower leg    Stress incontinence, female 08/10/2017   Thoracic or lumbosacral neuritis or radiculitis 04/11/2012   Overview:  L4-S1 proven by EMG ICD-10 cut over    Tobacco abuse 08/10/2017   stopped smoking 8 weeks ago-   currently taking chantix   Transient ischemic attack (TIA)    Weight loss, unintentional 01/20/2018     Social History   Tobacco Use   Smoking status: Every Day    Types: E-cigarettes   Smokeless tobacco: Never   Tobacco comments:    Started vaping 8 months and when she quit cigarettes-09/29/2023  Vaping Use   Vaping status: Never Used  Substance Use Topics   Alcohol use: No   Drug use: Not Currently    Types: Marijuana    Ms.Bergerson reports that she has been smoking e-cigarettes. She has never used smokeless tobacco. She reports that she does not currently use drugs after having used the following drugs: Marijuana. She reports that she does not drink alcohol.  Tobacco Cessation: Ready to quit: Not Answered Counseling given: Not Answered Tobacco comments: Started vaping 8 months and when she quit cigarettes-09/29/2023 Current every day smoker , currently also vaping  Current every day smoker , I spent 3-4 minutes counseling patient on  steps to stop use of tobacco products. I have provided patient with information on receiving free nicotine replacement therapy, and contact numbers for hypnosis for smoking cessation as well as acupuncture for smoking cessation.   Past surgical hx, Family hx, Social hx all reviewed.  Current Outpatient Medications on File Prior to Visit  Medication Sig   buPROPion (WELLBUTRIN XL) 150 MG 24 hr tablet Take 150 mg by mouth daily.   carvedilol  (COREG ) 6.25 MG tablet TAKE 1 TABLET BY MOUTH TWICE DAILY- HOLD IF SYSTOLIC BLOOD PRESSURE IS<100   diazepam (VALIUM) 5 MG tablet Take 2.5 mg by mouth every 8 (eight) hours as needed for anxiety.   gabapentin (NEURONTIN) 300 MG capsule Take 600-1,200 mg by mouth See admin instructions. Take 600 mg (2 capsules) by mouth every morning and 1,200 mg (4 capsules) at bedtime.   HYDROcodone-acetaminophen  (NORCO) 10-325 MG tablet Take 1.5 tablets by mouth every 8 (eight) hours as needed for moderate pain.   traZODone  (DESYREL) 150 MG tablet Take 150 mg by mouth at bedtime.   No current facility-administered medications on file prior to visit.     Allergies  Allergen Reactions   Oxycodone Nausea And Vomiting   Metoprolol  Succinate [Metoprolol ] Rash    Raised, red whelps   Morphine Rash    Review Of Systems:  Constitutional:   No  weight loss, night sweats,  Fevers, chills, fatigue, or  lassitude.  HEENT:   No headaches,  Difficulty swallowing,  Tooth/dental problems, or  Sore throat,                No sneezing, itching, ear ache, nasal congestion, post nasal drip,   CV:  No chest pain,  Orthopnea, PND, swelling in lower extremities, anasarca, dizziness, palpitations, syncope.   GI  No heartburn, indigestion, abdominal pain, nausea, vomiting, diarrhea, change in bowel habits, loss of appetite, bloody stools.   Resp: + shortness of breath with exertion less at rest.  No excess mucus, no productive cough,  No non-productive cough,  No coughing up of blood.  No change in color of mucus.  No wheezing.  No chest wall deformity  Skin: no rash or lesions.  GU: no dysuria, change in color of urine, no urgency or frequency.  No flank pain, no hematuria   MS:  No joint pain or swelling.  No decreased range of motion.  No back pain.  Psych:  No change in mood or affect. No depression or anxiety.  No memory loss.   Vital Signs BP 104/67 (BP Location: Left Arm, Patient Position: Sitting, Cuff Size: Normal)   Pulse 65   Ht 5' 1 (1.549 m)   Wt 110 lb 3.2 oz (50 kg)   SpO2 99%   BMI 20.82 kg/m    Physical Exam:  General- No distress,  A&Ox3, pleasant ENT: No sinus tenderness, TM clear, pale nasal mucosa, no oral exudate,no post nasal drip, no LAN Cardiac: S1, S2, regular rate and rhythm, no murmur Chest: No wheeze/ rales/ dullness; no accessory muscle use, no nasal flaring, no sternal retractions, slightly diminished per bases Abd.: Soft Non-tender, ND, BS +, Body mass index is 20.82 kg/m.   Ext: No clubbing cyanosis, edema, no obvious deformities Neuro:  normal strength, MEA x 4, A&O x 3, appropriate Skin: No rashes, warm and dry, No obvious skin lesions  Psych: normal mood and behavior   Assessment/Plan Multiple small pulmonary nodules on LDCT Current every day smoker  Dyspnea on exertion Plan We have  reviewed your Lung Cancer Screening scan. The very small nodule found on the most recent scan may not have been seen on the previous scan as the CTA is not as sensitive to small nodules as the Lung Cancer Screening scan is. We will do a 3 month follow up CT Chest to ensure these small nodules are stable. We will also do PFT's to better evaluate your breathing function. You will get calls to get both of these tests scheduled. You will follow up with me after the PFT's to review results and determine if you need any maintenance treatment. Consider joining a gym under the [employer] Program.  Start out slowly and work your way up.  Call if you need us  sooner. Note your daily symptoms > remember red flags for COPD:  Increase in cough, increase in sputum production, increase in shortness of breath or activity intolerance. If you notice these symptoms, please call to be seen.    Please contact office for sooner follow up if symptoms do not improve or worsen or seek emergency care    I spent 35 minutes dedicated to the care of this patient on the date of this encounter to include pre-visit review of records, face-to-face time with the patient discussing conditions above, post visit ordering of testing, clinical documentation with the electronic health record, making appropriate referrals as documented, and communicating necessary information to the patient's healthcare team.    Lauraine JULIANNA Lites, NP 09/29/2023  9:06 AM

## 2023-09-29 NOTE — Patient Instructions (Addendum)
 It is good to see you today. We have reviewed your Lung Cancer Screening scan. The very small nodule found on the most recent scan may not have been seen on the previous scan as the CTA is not as sensitive to small nodules as the Lung Cancer Screening scan is. We will do a 3 month follow up CT Chest to ensure these small nodules are stable. We will also do PFT's to better evaluate your breathing function. You will get calls to get both of these tests scheduled. You will follow up with me after the PFT's to review results and determine if you need any maintenance treatment. Consider joining a gym under the [employer] Program.  Start out slowly and work your way up.  Call if you need us  sooner. Note your daily symptoms > remember "red flags" for COPD:  Increase in cough, increase in sputum production, increase in shortness of breath or activity intolerance. If you notice these symptoms, please call to be seen.    Please contact office for sooner follow up if symptoms do not improve or worsen or seek emergency care

## 2023-10-28 DIAGNOSIS — Z760 Encounter for issue of repeat prescription: Secondary | ICD-10-CM | POA: Diagnosis not present

## 2023-10-28 DIAGNOSIS — G2581 Restless legs syndrome: Secondary | ICD-10-CM | POA: Diagnosis not present

## 2023-10-28 DIAGNOSIS — R251 Tremor, unspecified: Secondary | ICD-10-CM | POA: Diagnosis not present

## 2023-10-28 DIAGNOSIS — G43709 Chronic migraine without aura, not intractable, without status migrainosus: Secondary | ICD-10-CM | POA: Diagnosis not present

## 2023-10-28 DIAGNOSIS — G894 Chronic pain syndrome: Secondary | ICD-10-CM | POA: Diagnosis not present

## 2023-10-28 DIAGNOSIS — Z79899 Other long term (current) drug therapy: Secondary | ICD-10-CM | POA: Diagnosis not present

## 2023-10-28 DIAGNOSIS — M7912 Myalgia of auxiliary muscles, head and neck: Secondary | ICD-10-CM | POA: Diagnosis not present

## 2023-10-28 DIAGNOSIS — M7918 Myalgia, other site: Secondary | ICD-10-CM | POA: Diagnosis not present

## 2023-11-09 ENCOUNTER — Ambulatory Visit: Admitting: Cardiology

## 2023-11-10 ENCOUNTER — Ambulatory Visit

## 2023-11-12 ENCOUNTER — Other Ambulatory Visit: Payer: Self-pay | Admitting: Physician Assistant

## 2023-11-17 ENCOUNTER — Other Ambulatory Visit: Payer: Self-pay

## 2023-11-17 MED ORDER — CARVEDILOL 6.25 MG PO TABS
ORAL_TABLET | ORAL | 0 refills | Status: DC
Start: 2023-11-17 — End: 2024-02-08

## 2023-11-19 ENCOUNTER — Ambulatory Visit: Admitting: Emergency Medicine

## 2023-11-20 ENCOUNTER — Other Ambulatory Visit: Payer: Self-pay | Admitting: Physician Assistant

## 2023-11-23 ENCOUNTER — Inpatient Hospital Stay (HOSPITAL_BASED_OUTPATIENT_CLINIC_OR_DEPARTMENT_OTHER): Admission: RE | Admit: 2023-11-23 | Source: Ambulatory Visit | Admitting: Radiology

## 2023-11-30 ENCOUNTER — Ambulatory Visit: Admitting: Acute Care

## 2023-11-30 ENCOUNTER — Ambulatory Visit (INDEPENDENT_AMBULATORY_CARE_PROVIDER_SITE_OTHER)
Admission: RE | Admit: 2023-11-30 | Discharge: 2023-11-30 | Disposition: A | Discharge status: Home / Self Care | Source: Ambulatory Visit | Attending: Acute Care | Admitting: Acute Care

## 2023-11-30 DIAGNOSIS — R918 Other nonspecific abnormal finding of lung field: Secondary | ICD-10-CM

## 2023-11-30 DIAGNOSIS — I700 Atherosclerosis of aorta: Secondary | ICD-10-CM | POA: Diagnosis not present

## 2023-12-10 ENCOUNTER — Telehealth: Payer: Self-pay | Admitting: Acute Care

## 2023-12-10 NOTE — Telephone Encounter (Signed)
 Left voicemail for patient to call back to schedule PFT prior to next appointment with Lauraine Lites, NP on 8/26.

## 2023-12-22 NOTE — Telephone Encounter (Signed)
 Spoke with patient about getting PFT scheduled, states she can only come on Tuesdays; but her office visit is scheduled for next Tuesday at 8:30am. Lauraine do you want to reschedule her appointment with you until we can do the PFT?

## 2023-12-23 NOTE — Telephone Encounter (Signed)
 I will reschedule patient- trying to work the schedule to get her in as soon as possible.

## 2023-12-24 NOTE — Telephone Encounter (Signed)
 Patient is rescheduled to 9/16 for PFT then seeing Lauraine Lites, NP after. Nothing further needed

## 2023-12-28 ENCOUNTER — Ambulatory Visit: Admitting: Acute Care

## 2023-12-31 NOTE — Progress Notes (Deleted)
  Cardiology Office Note   Date:  12/31/2023  ID:  Nancy, Chavez Feb 03, 1956, MRN [id number] PCP: Nancy Nest, MD  [facility] Providers Cardiologist:  Dub Huntsman, DO { Click to update primary MD,subspecialty MD or APP then REFRESH:1}    History of Present Illness Nancy Chavez is a 68 y.o. female with a past medical history of nonobstructive CAD, Takotsubo cardiomyopathy, carotid artery stenosis--follows with [facility], history of stroke, GERD, DM2, carpal tunnel syndrome, dyslipidemia.  10/29/2022 carotid duplex mild bilateral carotid artery stenosis 06/24/2022 echo EF 60 to 65%, grade 1 DD, no valvular abnormalities 03/13/2022 right left heart cath mild nonobstructive CAD 03/11/2022 echo EF 25 to 30%, severe hypokinesis from mid to apex  She initially established with Dr. Huntsman in 2021 for evaluation of shortness of breath.  She underwent a stress evaluation at this time which was normal.  In November 2023 she was admitted to [facility] with intractable nausea and vomiting for several weeks, found to have elevated troponin, treated for non-STEMI, proBNP elevated at 7180, echo revealed EF 25 to 30% consistent with Takotsubo cardiomyopathy >> transferred to [facility] underwent left heart cath revealing mild nonobstructive CAD, her husband apparently passed away 4 months prior.  She was started on lisinopril  and Coreg  and subsequent echocardiogram in February 2024 revealed normalization of her EF.  Most recently she was evaluated by Dr. Monetta on 09/01/2022, stable from a cardiac perspective, plans to follow-up in 1 year.  ROS: ***  Studies Reviewed      *** Risk Assessment/Calculations {Does this patient have ATRIAL FIBRILLATION?[phone]} No BP recorded.  {Refresh Note OR Click here to enter BP  :1}***       Physical Exam VS:  There were no vitals taken for this visit.       Wt Readings from Last 3 Encounters:  09/29/23 110 lb 3.2 oz (50 kg)  10/29/22  106 lb (48.1 kg)  09/01/22 109 lb 12.8 oz (49.8 kg)    GEN: Well nourished, well developed in no acute distress NECK: No JVD; No carotid bruits CARDIAC: ***RRR, no murmurs, rubs, gallops RESPIRATORY:  Clear to auscultation without rales, wheezing or rhonchi  ABDOMEN: Soft, non-tender, non-distended EXTREMITIES:  No edema; No deformity   ASSESSMENT AND PLAN CAD-mild nonobstructive Heart failure with improved ejection fraction-    {Are you ordering a CV Procedure (e.g. stress test, cath, DCCV, TEE, etc)?   Press F2        :[id number]}  Dispo: ***  Signed, Chavez JAYSON Hoover, NP

## 2024-01-04 ENCOUNTER — Encounter: Payer: Self-pay | Admitting: *Deleted

## 2024-01-04 ENCOUNTER — Ambulatory Visit: Attending: Cardiology | Admitting: Cardiology

## 2024-01-04 DIAGNOSIS — G250 Essential tremor: Secondary | ICD-10-CM

## 2024-01-04 DIAGNOSIS — I5032 Chronic diastolic (congestive) heart failure: Secondary | ICD-10-CM

## 2024-01-04 DIAGNOSIS — I6523 Occlusion and stenosis of bilateral carotid arteries: Secondary | ICD-10-CM

## 2024-01-04 DIAGNOSIS — G43709 Chronic migraine without aura, not intractable, without status migrainosus: Secondary | ICD-10-CM

## 2024-01-04 DIAGNOSIS — G2581 Restless legs syndrome: Secondary | ICD-10-CM

## 2024-01-04 DIAGNOSIS — G894 Chronic pain syndrome: Secondary | ICD-10-CM

## 2024-01-04 DIAGNOSIS — Z8673 Personal history of transient ischemic attack (TIA), and cerebral infarction without residual deficits: Secondary | ICD-10-CM

## 2024-01-04 DIAGNOSIS — I2510 Atherosclerotic heart disease of native coronary artery without angina pectoris: Secondary | ICD-10-CM

## 2024-01-04 HISTORY — DX: Chronic pain syndrome: G89.4

## 2024-01-04 HISTORY — DX: Restless legs syndrome: G25.81

## 2024-01-04 HISTORY — DX: Chronic migraine without aura, not intractable, without status migrainosus: G43.709

## 2024-01-04 HISTORY — DX: Essential tremor: G25.0

## 2024-01-11 ENCOUNTER — Ambulatory Visit: Admitting: Cardiology

## 2024-01-18 ENCOUNTER — Encounter: Payer: Self-pay | Admitting: Acute Care

## 2024-01-18 ENCOUNTER — Ambulatory Visit (INDEPENDENT_AMBULATORY_CARE_PROVIDER_SITE_OTHER): Admitting: Emergency Medicine

## 2024-01-18 ENCOUNTER — Ambulatory Visit: Admitting: Acute Care

## 2024-01-18 VITALS — BP 125/71 | HR 59 | Temp 98.0°F | Ht 61.75 in | Wt 113.2 lb

## 2024-01-18 DIAGNOSIS — R911 Solitary pulmonary nodule: Secondary | ICD-10-CM

## 2024-01-18 DIAGNOSIS — R9389 Abnormal findings on diagnostic imaging of other specified body structures: Secondary | ICD-10-CM

## 2024-01-18 DIAGNOSIS — J449 Chronic obstructive pulmonary disease, unspecified: Secondary | ICD-10-CM | POA: Diagnosis not present

## 2024-01-18 DIAGNOSIS — F17290 Nicotine dependence, other tobacco product, uncomplicated: Secondary | ICD-10-CM

## 2024-01-18 DIAGNOSIS — Z720 Tobacco use: Secondary | ICD-10-CM

## 2024-01-18 DIAGNOSIS — R918 Other nonspecific abnormal finding of lung field: Secondary | ICD-10-CM

## 2024-01-18 DIAGNOSIS — R0609 Other forms of dyspnea: Secondary | ICD-10-CM | POA: Diagnosis not present

## 2024-01-18 DIAGNOSIS — J439 Emphysema, unspecified: Secondary | ICD-10-CM

## 2024-01-18 LAB — PULMONARY FUNCTION TEST
DL/VA % pred: 65 %
DL/VA: 2.77 ml/min/mmHg/L
DLCO unc % pred: 65 %
DLCO unc: 12.08 ml/min/mmHg
FEF 25-75 Post: 2.43 L/s
FEF 25-75 Pre: 1.09 L/s
FEF2575-%Change-Post: 122 %
FEF2575-%Pred-Post: 121 %
FEF2575-%Pred-Pre: 54 %
FEV1-%Change-Post: 18 %
FEV1-%Pred-Post: 92 %
FEV1-%Pred-Pre: 77 %
FEV1-Post: 2.03 L
FEV1-Pre: 1.71 L
FEV1FVC-%Change-Post: 4 %
FEV1FVC-%Pred-Pre: 92 %
FEV6-%Change-Post: 14 %
FEV6-%Pred-Post: 97 %
FEV6-%Pred-Pre: 85 %
FEV6-Post: 2.7 L
FEV6-Pre: 2.36 L
FEV6FVC-%Change-Post: 0 %
FEV6FVC-%Pred-Post: 103 %
FEV6FVC-%Pred-Pre: 102 %
FVC-%Change-Post: 13 %
FVC-%Pred-Post: 94 %
FVC-%Pred-Pre: 82 %
FVC-Post: 2.72 L
FVC-Pre: 2.4 L
Post FEV1/FVC ratio: 75 %
Post FEV6/FVC ratio: 99 %
Pre FEV1/FVC ratio: 71 %
Pre FEV6/FVC Ratio: 98 %
RV % pred: 124 %
RV: 2.46 L
TLC % pred: 108 %
TLC: 5.12 L

## 2024-01-18 MED ORDER — ALBUTEROL SULFATE HFA 108 (90 BASE) MCG/ACT IN AERS: 1.0000 | INHALATION_SPRAY | Freq: Four times a day (QID) | RESPIRATORY_TRACT | 2 refills | Status: DC | PRN

## 2024-01-18 NOTE — Progress Notes (Signed)
 History of Present Illness Nancy Chavez is a 68 y.o. female  recently former smoker( quit 09/29/2023 , now vaping) referred by Dr. Delon Contes 09/2023 for abnormal lung cancer screening. She will be followed by Dr. Shelah.    01/18/2024 Discussed the use of AI scribe software for clinical note transcription with the patient, who gave verbal consent to proceed.  History of Present Illness Nancy Chavez is a 68 year old female current every day smoker with mild emphysema who presents to evaluate her PFT's and for CT Chest review, as we are monitoring lung nodules noted on LDCT 08/2023.   She experiences shortness of breath primarily during exertion and does not experience it during normal activities. No wheezing is present. She has a history of mild emphysema and quit smoking cigarettes 8 months to a year ago, but continues to vape.She has a cigarette pack year smoking history of  history of 20+ pack years.  We have reviewed her PFT's. Pulmonary function tests show  F/F Ratio of 71%, and FEV1 of 77%. DLCO is 65% predicted.  She is not currently using an inhaler. She has good bronchodilator response. We discussed using a maintenance inhaler vs just rescue for now. She is concerned about cost. Plan for now will be to use rescue as needed for shortness of breath or wheezing, and if she needs this more than 4 times a week, we will consider maintenance.   A recent follow up CT scan showed two small subpleural nodules in the left lower lobe and a stable two-millimeter nodule in the right middle lobe. These are stable, plan will be for a 6 month follow up scan. If 6 month follow up is stable, we will consider returning her to the Lung Cancer Screening Population.  CT scan also showed atherosclerosis. Her family history is significant for heart disease on her mother's side. She has seen a cardiologist for palpitations but has not been able to follow up due to the doctor's illness. She has an upcoming  appointment with another cardiologist in the same office. She will follow up with him regarding need for statin therapy.      Test Results: 01/18/2024 PFT          CT chest 11/30/2023 Mediastinum/Nodes: No enlarged mediastinal or axillary lymph nodes. Thyroid  gland, trachea, and esophagus demonstrate no significant findings.   Lungs/Pleura: No pneumothorax or pleural effusion is noted. Minimal right middle lobe subsegmental atelectasis or scarring is noted. 4 x 3 mm subpleural nodule is noted posteriorly in left lower lobe best seen on image number 80 of series 302. Another nodule measuring 3 x 2 mm is noted slightly more superiorly on image number 70 of series 302. This appears to be smaller compared to prior exam of August 23, 2023 performed at [facility]. Lingular nodule noted on prior exam is not well visualized currently. Stable 2 mm nodule seen laterally in right middle lobe best seen on image number 75 of series 302. Impression Two small subpleural nodules are noted posteriorly in left lower lobe most likely are inflammatory in etiology. Grossly stable 2 mm nodule is noted in right middle lobe. If patient is low risk for malignancy, no routine follow-up imaging is recommended. If patient is high risk for malignancy, a non-contrast chest CT at 12 months is optional.       Latest Ref Rng & Units 03/30/2022    9:54 AM 03/13/2022    5:00 PM 03/13/2022    4:58 PM  CBC  Hemoglobin 11.1 - 15.9 g/dL 85.0  86.0  86.0   Hematocrit 36.0 - 46.0 %  41.0  41.0        Latest Ref Rng & Units 03/13/2022    5:00 PM 03/13/2022    4:58 PM 03/13/2022    4:51 PM  BMP  Sodium 135 - 145 mmol/L 139  139  138   Potassium 3.5 - 5.1 mmol/L 3.7  3.7  3.7     BNP No results found for: BNP  ProBNP No results found for: PROBNP  PFT    Component Value Date/Time   FEV1PRE 1.71 01/18/2024 0941   FEV1POST 2.03 01/18/2024 0941   FVCPRE 2.40 01/18/2024 0941   FVCPOST 2.72  01/18/2024 0941   TLC 5.12 01/18/2024 0941   DLCOUNC 12.08 01/18/2024 0941   PREFEV1FVCRT 71 01/18/2024 0941   PSTFEV1FVCRT 75 01/18/2024 0941    No results found.   Past medical hx Past Medical History:  Diagnosis Date   Acquired spondylolisthesis 04/11/2012   Overview:  L4/5   Allergy    seasonal allergies   Anxiety 07/20/2017   on meds   Asymptomatic microscopic hematuria 12/09/2016   Backache 04/11/2012   Benzodiazepine dependence (HCC) 07/12/2017   Cancer (HCC)    Carpal tunnel syndrome of right wrist 09/03/2014   Cerebral artery occlusion with cerebral infarction (HCC) 08/10/2017   to stop smoking,  d/c coumadin,  begin asa qd   Cerebral artery occlusion with cerebral infarction (HCC) 2002/2016   Chronic back pain    Chronic radicular cervical pain 08/10/2017   followed by neurology   Colitis 08/10/2017   continue meds per ER   Cyst of thyroid  08/10/2017   Diverticulosis 08/10/2017   Drug withdrawal syndrome (HCC) 07/12/2017   Dysphagia 05/19/2017   Elevated glucose 08/10/2017   Epigastric pain 07/29/2017   Gastroesophageal reflux disease without esophagitis 08/10/2017   diet controlled as of 05/27/2020   Hiatal hernia 08/10/2017   History of colon polyps 08/10/2017   History of CVA (cerebrovascular accident) 12/24/2014   Formatting of this note might be different from the original. 2006   Hormone replacement therapy (HRT) 06/17/2017   Hx-TIA (transient ischemic attack) 12/24/2014   Hypercalcemia 08/10/2017   Insomnia 07/20/2017   Low thyroid  stimulating hormone (TSH) level 01/20/2018   Lumbar radiculopathy, chronic 08/10/2017   Formatting of this note might be different from the original. followed by neurology   Menopause 08/10/2017   script for prevo   Mixed dyslipidemia 08/10/2017   at goal,  continue low fat low cholesterol diet.   Mixed emotional features as adjustment reaction 08/10/2017   Mixed hyperlipidemia 08/10/2017   on meds   Neck pain 08/10/2017   Prednisone 20mg  2 qd x7d and Lortab  5mg  q4hrs prn #40. If pain persists or worsens may need MRI.   Osteoporosis 2021   confirmed by bone density testing   Pain in joint, pelvic region and thigh 04/11/2012   Palpitations 06/21/2019   Precordial pain 06/21/2019   Primary osteoarthritis of first carpometacarpal joint of right hand 09/03/2014   generalized arthritis   PVC (premature ventricular contraction) 06/21/2019   Recurrent nephrolithiasis 12/13/2016   Renal cyst 12/13/2016   S/P lumbar fusion 05/13/2016   Seizures (HCC) 2018   Shortness of breath 06/21/2019   Situational mixed anxiety and depressive disorder 08/10/2017   on meds   Spinal stenosis of lumbar region without neurogenic claudication 04/11/2012   Overview:  L4/5   Squamous cell carcinoma  of right lower leg    Stress incontinence, female 08/10/2017   Thoracic or lumbosacral neuritis or radiculitis 04/11/2012   Overview:  L4-S1 proven by EMG ICD-10 cut over    Tobacco abuse 08/10/2017   stopped smoking 8 weeks ago-  currently taking chantix   Transient ischemic attack (TIA)    Weight loss, unintentional 01/20/2018     Social History   Tobacco Use   Smoking status: Every Day    Types: E-cigarettes   Smokeless tobacco: Never   Tobacco comments:    Started vaping 8 months and when she quit cigarettes-09/29/2023        Still currently vapes KRD 01/18/2024  Vaping Use   Vaping status: Never Used  Substance Use Topics   Alcohol use: No   Drug use: Not Currently    Types: Marijuana    Ms.Behe reports that she has been smoking e-cigarettes. She has never used smokeless tobacco. She reports that she does not currently use drugs after having used the following drugs: Marijuana. She reports that she does not drink alcohol.  Tobacco Cessation: Ready to quit: Not Answered Counseling given: Not Answered Tobacco comments: Started vaping 8 months and when she quit cigarettes-09/29/2023  Still currently vapes KRD 01/18/2024 20+ pack year smoking history  Past surgical hx,  Family hx, Social hx all reviewed.  Current Outpatient Medications on File Prior to Visit  Medication Sig   buPROPion (WELLBUTRIN XL) 150 MG 24 hr tablet Take 150 mg by mouth daily.   carvedilol  (COREG ) 6.25 MG tablet TAKE 1 TABLET BY MOUTH TWICE DAILY- HOLD IF SYSTOLIC BLOOD PRESSURE IS<100   diazepam (VALIUM) 5 MG tablet Take 2.5 mg by mouth every 8 (eight) hours as needed for anxiety.   gabapentin (NEURONTIN) 300 MG capsule Take 600-1,200 mg by mouth See admin instructions. Take 600 mg (2 capsules) by mouth every morning and 1,200 mg (4 capsules) at bedtime.   HYDROcodone-acetaminophen  (NORCO) 10-325 MG tablet Take 1.5 tablets by mouth every 8 (eight) hours as needed for moderate pain.   traZODone (DESYREL) 150 MG tablet Take 150 mg by mouth at bedtime.   No current facility-administered medications on file prior to visit.     Allergies  Allergen Reactions   Oxycodone Nausea And Vomiting   Metoprolol  Succinate [Metoprolol ] Rash    Raised, red whelps   Morphine Rash    Review Of Systems:  Constitutional:   No  weight loss, night sweats,  Fevers, chills, fatigue, or  lassitude.  HEENT:   No headaches,  Difficulty swallowing,  Tooth/dental problems, or  Sore throat,  No sneezing, itching, ear ache, nasal congestion, post nasal drip,   CV:  No chest pain,  Orthopnea, PND, swelling in lower extremities, anasarca, dizziness, palpitations, syncope.   GI  No heartburn, indigestion, abdominal pain, nausea, vomiting, diarrhea, change in bowel habits, loss of appetite, bloody stools.   Resp: No shortness of breath with exertion or at rest.  No excess mucus, no productive cough,  No non-productive cough,  No coughing up of blood.  No change in color of mucus.  No wheezing.  No chest wall deformity  Skin: + skin peeling around ears and on her nose, no rash or lesions.  GU: no dysuria, change in color of urine, no urgency or frequency.  No flank pain, no hematuria   MS:  No joint pain or  swelling.  No decreased range of motion.  No back pain.  Psych:  No change in mood or  affect. No depression or anxiety.  No memory loss.   Vital Signs BP 125/71   Pulse (!) 59   Temp 98 F (36.7 C) (Oral)   Ht 5' 1.75 (1.568 m)   Wt 113 lb 3.2 oz (51.3 kg)   SpO2 99%   BMI 20.87 kg/m    Physical Exam:  General- No distress,  A&Ox3 ENT: No sinus tenderness, TM clear, pale nasal mucosa, no oral exudate,no post nasal drip, no LAN Cardiac: S1, S2, regular rate and rhythm, no murmur Chest: No wheeze/ rales/ dullness; no accessory muscle use, no nasal flaring, no sternal retractions Abd.: Soft Non-tender Ext: No clubbing cyanosis, edema Neuro:  normal strength Skin: No rashes, warm and dry Psych: normal mood and behavior    Assessment & Plan Chronic obstructive pulmonary disease (COPD) with reduced diffusion capacity and exertional dyspnea Mild COPD with decreased diffusion capacity at 65%,  Exertional dyspnea present, no wheezing.  Good bronchodilator response to albuterol . - Prescribe albuterol  inhaler, 1-2 puffs every 6 hours as needed for shortness of breath. - Instruct to monitor usage; if using more than 4 times a day, call to consider starting a maintenance inhaler.  Pulmonary nodules under surveillance Stable small pulmonary nodules No acute abnormalities in the abdomen, chest wall, or lymph nodes.  Atherosclerosis of the thoracic aorta noted.  Continued vaping necessitates a 53-month follow-up CT to ensure stability before annual monitoring is resumed . - Order follow-up CT scan in 6 months to monitor nodules. - Provide a copy of the CT report for her cardiologist.  Nicotine dependence (vaping) Continues to vape despite quitting smoking cigarettes 8 months to a year ago. Vaping may contribute to pulmonary issues and nodule changes. Plan Pt advised on vaping cessation x 3 minutes  I spent 30 minutes dedicated to the care of this patient on the date of this  encounter to include pre-visit review of records, face-to-face time with the patient discussing conditions above, post visit ordering of testing, clinical documentation with the electronic health record, making appropriate referrals as documented, and communicating necessary information to the patient's healthcare team.    AVS 01/18/2024 We have reviewed your pulmonary function testing which shows mild to moderate COPD with a moderately decreased diffusion capacity. As you are not experiencing shortness of breath with the exception of exertion, we will start albuterol  1 to 2 puffs as needed for shortness of breath or wheezing. If you need to use this more than 4 times a week I want you to let me know so I can send in a maintenance inhaler, which is an inhaler to use every day regardless of whether you feel good or bad. Maintenance inhalers decrease inflammatory changes in your lungs as well as help to open the airways to make your breathing easier. We have reviewed the results of your CT scan. This shows stable lung nodules. As you are continuing to smoke we will do a 62-month follow-up. He will follow-up with me after that scan to review results. If the nodules of concern show continued stability plan will be to return you to the lung cancer screening population for annual screening. Please call for any unintentional weight loss or blood in your secretions when you cough. Call for any COPD flare. Note your daily symptoms > remember red flags for COPD:  Increase in cough, increase in sputum production, increase in shortness of breath or activity intolerance. If you notice these symptoms, please call to be seen.    Remember to call  me if you use your rescue inhaler more than 4 times a week so we can get you started on maintenance. Have a great fall.   Lauraine JULIANNA Lites, NP 01/18/2024  11:00 AM

## 2024-01-18 NOTE — Patient Instructions (Addendum)
 It is good to see you today. We have reviewed your pulmonary function testing which shows mild to moderate COPD with a moderately decreased diffusion capacity. As you are not experiencing shortness of breath with the exception of exertion, we will start albuterol  1 to 2 puffs as needed for shortness of breath or wheezing. If you need to use this more than 4 times a week I want you to let me know so I can send in a maintenance inhaler, which is an inhaler to use every day regardless of whether you feel good or bad. Maintenance inhalers decrease inflammatory changes in your lungs as well as help to open the airways to make your breathing easier. We have reviewed the results of your CT scan. This shows stable lung nodules. As you are continuing to smoke we will do a 37-month follow-up. He will follow-up with me after that scan to review results. If the nodules of concern show continued stability plan will be to return you to the lung cancer screening population for annual screening. Please call for any unintentional weight loss or blood in your secretions when you cough. Call for any COPD flare. Note your daily symptoms > remember red flags for COPD:  Increase in cough, increase in sputum production, increase in shortness of breath or activity intolerance. If you notice these symptoms, please call to be seen.    Remember to call me if you use your rescue inhaler more than 4 times a week so we can get you started on maintenance. Have a great fall.

## 2024-01-18 NOTE — Patient Instructions (Signed)
 Full PFT performed today.

## 2024-01-18 NOTE — Progress Notes (Signed)
 Full PFT performed today.

## 2024-01-25 ENCOUNTER — Other Ambulatory Visit: Payer: Self-pay | Admitting: Cardiology

## 2024-01-25 ENCOUNTER — Other Ambulatory Visit: Payer: Self-pay | Admitting: Physician Assistant

## 2024-02-04 NOTE — Progress Notes (Signed)
 Cardiology Office Note   Date:  02/08/2024  ID:  MEILI KLECKLEY, DOB 04-11-1956, MRN [id number] PCP: Clemmie Nest, MD  [facility] Providers Cardiologist:  Redell Leiter, MD     History of Present Illness Nancy Chavez is a 68 y.o. female with a past medical history of stroke, mild nonobstructive CAD, Takotsubo cardiomyopathy, GERD, DM2, chronic pain syndrome, carotid artery stenosis follows with [facility], dyslipidemia, seizure disorder, tobacco abuse, emphysema.  10/29/2022 carotid ultrasound mild bilateral carotid artery stenosis 06/24/2022 echo EF 60 to 65%, grade 1 DD 03/13/2022 cardiac cath mild nonobstructive CAD 03/11/2022 echo EF 25 to 30%, Takotsubo cardiomyopathy, severe hypokinesis 01/24/2020 carotid duplex mild bilateral carotid artery stenosis 07/20/2019 Lexiscan  normal, low risk study  [facility] with [facility] in 2021 for the evaluation of shortness of breath, she underwent a stress evaluation which was abnormal, low risk study.  In November 2023 she presented to [facility] for 2-week history of intractable nausea and vomiting, CT revealed severe esophagitis, troponins were elevated and she was subsequently transferred to [facility] for non-STEMI.  She underwent a left heart cath revealing normal coronary arteries, echocardiogram at that time revealed a EF of 25 to 30% Takotsubo cardiomyopathy, she was given lisinopril  5 mg daily and carvedilol  3.125 mg twice daily.  Echocardiogram in February 2024 revealed normalization of her EF.  She is also followed by [facility] for carotid artery stenosis.  Most recently she was evaluated by Dr. Leiter on 09/01/2022, she was doing well from a cardiac perspective, she was started on lipid-lowering medication, it appears her lisinopril  was also stopped at this time and she was advised to follow-up in 1 year.  She presents today for follow-up, she has had a relatively rough year since she was last evaluated in our office, lost  her daughter and has been dealing with that grief.  As a result of that she has not been taking some of her medications.  From a cardiac perspective, she has been doing okay, mention she occasionally has episodes of chest pain typically at rest, none with exertion. She denies chest pain, palpitations, dyspnea, pnd, orthopnea, n, v, dizziness, syncope, edema, weight gain, or early satiety.      ROS: Review of Systems  Cardiovascular:  Positive for chest pain.  All other systems reviewed and are negative.    Studies Reviewed      Cardiac Studies & Procedures   ______________________________________________________________________________________________ CARDIAC CATHETERIZATION  CARDIAC CATHETERIZATION 03/13/2022  Conclusion 1.  Mild obstructive coronary artery disease. 2.  Ventriculography consistent with recovering Takotsubo cardiomyopathy with an LVEDP of 19 mmHg.  Recommendation: Medical therapy and same-day discharge with close cardiology follow-up.  Findings Coronary Findings Diagnostic  Dominance: Right  Left Anterior Descending The vessel exhibits minimal luminal irregularities.  Right Coronary Artery The vessel exhibits minimal luminal irregularities.  Intervention  No interventions have been documented.   STRESS TESTS  MYOCARDIAL PERFUSION IMAGING 07/20/2019  Interpretation Summary  Nuclear stress EF: 55%.  The left ventricular ejection fraction is normal (55-65%).  There was no ST segment deviation noted during stress.  No T wave inversion was noted during stress.  Defect 1: There is a small fixed defect of mild severity present in the apical anterior and apex location, with normal wall motion.  The study is normal. The fixed defect described above is likely soft tissue attenuation.  This is a low risk study.   ECHOCARDIOGRAM  ECHOCARDIOGRAM COMPLETE 06/24/2022  Narrative ECHOCARDIOGRAM REPORT    Patient Name:  Nancy Chavez [facility] Date of Exam:  06/24/2022 Medical Rec #:  [id number]      Height:       62.0 in Accession #:    [id number]     Weight:       107.8 lb Date of Birth:  27-Mar-1956      BSA:          1.470 m Patient Age:    66 years       BP:           110/68 mmHg Patient Gender: F              HR:           87 bpm. Exam Location:  [place]  Procedure: 2D Echo, Cardiac Doppler, Color Doppler and Strain Analysis  Indications:    Takotsubo cardiomyopathy [I51.81 (ICD-10-CM)]  History:        Patient has prior history of Echocardiogram examinations, most recent 03/11/2022. Stroke, Arrythmias:PVC, Signs/Symptoms:Fatigue and Precordial pain; Risk Factors:Diabetes and Dyslipidemia.  Sonographer:    Lynwood Silvas RDCS Referring Phys: [id number] TESSA N CONTE  IMPRESSIONS   1. Left ventricular ejection fraction, by estimation, is 60 to 65%. The left ventricle has normal function. The left ventricle has no regional wall motion abnormalities. Left ventricular diastolic parameters are consistent with Grade I diastolic dysfunction (impaired relaxation). GLS -10.1% 2. Right ventricular systolic function is normal. The right ventricular size is normal. There is normal pulmonary artery systolic pressure. 3. The mitral valve is normal in structure. No evidence of mitral valve regurgitation. No evidence of mitral stenosis. 4. The aortic valve is normal in structure. Aortic valve regurgitation is not visualized. No aortic stenosis is present. 5. The inferior vena cava is normal in size with greater than 50% respiratory variability, suggesting right atrial pressure of 3 mmHg.  FINDINGS Left Ventricle: Left ventricular ejection fraction, by estimation, is 60 to 65%. The left ventricle has normal function. The left ventricle has no regional wall motion abnormalities. The left ventricular internal cavity size was normal in size. There is no left ventricular hypertrophy. Left ventricular diastolic parameters are consistent with Grade I diastolic  dysfunction (impaired relaxation).  Right Ventricle: The right ventricular size is normal. No increase in right ventricular wall thickness. Right ventricular systolic function is normal. There is normal pulmonary artery systolic pressure. The tricuspid regurgitant velocity is 2.12 m/s, and with an assumed right atrial pressure of 8 mmHg, the estimated right ventricular systolic pressure is 26.0 mmHg.  Left Atrium: Left atrial size was normal in size.  Right Atrium: Right atrial size was normal in size.  Pericardium: There is no evidence of pericardial effusion.  Mitral Valve: The mitral valve is normal in structure. No evidence of mitral valve regurgitation. No evidence of mitral valve stenosis.  Tricuspid Valve: The tricuspid valve is normal in structure. Tricuspid valve regurgitation is not demonstrated. No evidence of tricuspid stenosis.  Aortic Valve: The aortic valve is normal in structure. Aortic valve regurgitation is not visualized. No aortic stenosis is present.  Pulmonic Valve: The pulmonic valve was normal in structure. Pulmonic valve regurgitation is not visualized. No evidence of pulmonic stenosis.  Aorta: The aortic root is normal in size and structure.  Venous: The inferior vena cava is normal in size with greater than 50% respiratory variability, suggesting right atrial pressure of 3 mmHg.  IAS/Shunts: No atrial level shunt detected by color flow Doppler.   LEFT VENTRICLE PLAX 2D LVIDd:  4.10 cm   Diastology LVIDs:         2.50 cm   LV e' medial:    4.87 cm/s LV PW:         1.00 cm   LV E/e' medial:  12.9 LV IVS:        1.00 cm   LV e' lateral:   6.00 cm/s LVOT diam:     2.00 cm   LV E/e' lateral: 10.4 LV SV:         47 LV SV Index:   32 LVOT Area:     3.14 cm   RIGHT VENTRICLE            IVC RV S prime:     8.11 cm/s  IVC diam: 2.10 cm TAPSE (M-mode): 1.9 cm  LEFT ATRIUM             Index        RIGHT ATRIUM          Index LA diam:        3.00 cm  2.04 cm/m   RA Area:     9.48 cm LA Vol (A2C):   34.2 ml 23.26 ml/m  RA Volume:   17.90 ml 12.18 ml/m LA Vol (A4C):   27.1 ml 18.43 ml/m LA Biplane Vol: 31.9 ml 21.70 ml/m AORTIC VALVE LVOT Vmax:   83.60 cm/s LVOT Vmean:  54.500 cm/s LVOT VTI:    0.149 m  AORTA Ao Root diam: 3.20 cm Ao Asc diam:  3.30 cm Ao Desc diam: 1.90 cm  MITRAL VALVE               TRICUSPID VALVE MV Area (PHT): 6.12 cm    TR Peak grad:   18.0 mmHg MV Decel Time: 124 msec    TR Vmax:        212.00 cm/s MV E velocity: 62.60 cm/s MV A velocity: 78.40 cm/s  SHUNTS MV E/A ratio:  0.80        Systemic VTI:  0.15 m Systemic Diam: 2.00 cm  Romualdo Prosise Crape MD Electronically signed by Hilmar Moldovan Crape MD Signature Date/Time: 06/24/2022/4:39:03 PM    Final          ______________________________________________________________________________________________      Risk Assessment/Calculations           Physical Exam VS:  BP 130/80   Pulse 72   Ht 5' 1.75 (1.568 m)   Wt 110 lb (49.9 kg)   SpO2 98%   BMI 20.28 kg/m        Wt Readings from Last 3 Encounters:  02/08/24 110 lb (49.9 kg)  01/18/24 113 lb 3.2 oz (51.3 kg)  09/29/23 110 lb 3.2 oz (50 kg)    GEN: Well nourished, well developed in no acute distress NECK: No JVD; No carotid bruits CARDIAC: RRR, no murmurs, rubs, gallops RESPIRATORY:  Clear to auscultation without rales, wheezing or rhonchi  ABDOMEN: Soft, non-tender, non-distended EXTREMITIES:  No edema; No deformity   ASSESSMENT AND PLAN HFimpEF - NYHA class I, euvolemic.  Will refill her Coreg  6.25 mg daily.  Mild CAD - occasional chest pain at rest, not consistent with angina.   Palpitations-somewhat bothersome for her, restarting her Coreg .  Carotid artery stenosis-follows with [facility].  History of stroke -continue aspirin  81 mg daily, inadvertently discontinued her pravastatin .  Will repeat c-Met direct LDL and LP(a) today.  Dyslipidemia -  inadvertently discontinued her  pravastatin .  Will repeat c-Met direct LDL and LP(a) today.  Dispo: Restart Coreg , CMET, direct LDL, LP(a), follow-up in 1 year.  Signed, Delon JAYSON Hoover, NP

## 2024-02-07 DIAGNOSIS — G8929 Other chronic pain: Secondary | ICD-10-CM | POA: Insufficient documentation

## 2024-02-08 ENCOUNTER — Encounter: Payer: Self-pay | Admitting: Cardiology

## 2024-02-08 ENCOUNTER — Ambulatory Visit: Attending: Cardiology | Admitting: Cardiology

## 2024-02-08 VITALS — BP 130/80 | HR 72 | Ht 61.75 in | Wt 110.0 lb

## 2024-02-08 DIAGNOSIS — I493 Ventricular premature depolarization: Secondary | ICD-10-CM | POA: Diagnosis not present

## 2024-02-08 DIAGNOSIS — Z8673 Personal history of transient ischemic attack (TIA), and cerebral infarction without residual deficits: Secondary | ICD-10-CM | POA: Diagnosis not present

## 2024-02-08 DIAGNOSIS — E782 Mixed hyperlipidemia: Secondary | ICD-10-CM | POA: Diagnosis not present

## 2024-02-08 DIAGNOSIS — R002 Palpitations: Secondary | ICD-10-CM

## 2024-02-08 DIAGNOSIS — I6523 Occlusion and stenosis of bilateral carotid arteries: Secondary | ICD-10-CM

## 2024-02-08 DIAGNOSIS — I5181 Takotsubo syndrome: Secondary | ICD-10-CM

## 2024-02-08 MED ORDER — CARVEDILOL 6.25 MG PO TABS: ORAL_TABLET | ORAL | 3 refills | Status: AC

## 2024-02-08 NOTE — Patient Instructions (Signed)
 Medication Instructions:  Restart Coreg    *If you need a refill on your cardiac medications before your next appointment, please call your pharmacy*  Lab Work: CMET Direct LDL LP(a)  If you have labs (blood work) drawn today and your tests are completely normal, you will receive your results only by: MyChart Message (if you have MyChart) OR A paper copy in the mail If you have any lab test that is abnormal or we need to change your treatment, we will call you to review the results.  Testing/Procedures: None today  Follow-Up: At [facility], you and your health needs are our priority.  As part of our continuing mission to provide you with exceptional heart care, our providers are all part of one team.  This team includes your primary Cardiologist (physician) and Advanced Practice Providers or APPs (Physician Assistants and Nurse Practitioners) who all work together to provide you with the care you need, when you need it.  Your next appointment:   1 year(s)  Provider:   Redell Leiter, MD    We recommend signing up for the patient portal called MyChart.  Sign up information is provided on this After Visit Summary.  MyChart is used to connect with patients for Virtual Visits (Telemedicine).  Patients are able to view lab/test results, encounter notes, upcoming appointments, etc.  Non-urgent messages can be sent to your provider as well.   To learn more about what you can do with MyChart, go to [email or web address].   Other Instructions

## 2024-02-09 LAB — COMPREHENSIVE METABOLIC PANEL WITH GFR
ALT: 14 IU/L (ref 0–32)
AST: 17 IU/L (ref 0–40)
Albumin: 4.1 g/dL (ref 3.9–4.9)
Alkaline Phosphatase: 84 IU/L (ref 49–135)
BUN/Creatinine Ratio: 19 (ref 12–28)
BUN: 13 mg/dL (ref 8–27)
Bilirubin Total: 0.3 mg/dL (ref 0.0–1.2)
CO2: 22 mmol/L (ref 20–29)
Calcium: 9.4 mg/dL (ref 8.7–10.3)
Chloride: 104 mmol/L (ref 96–106)
Creatinine, Ser: 0.7 mg/dL (ref 0.57–1.00)
Globulin, Total: 2 g/dL (ref 1.5–4.5)
Glucose: 101 mg/dL — ABNORMAL HIGH (ref 70–99)
Potassium: 4.3 mmol/L (ref 3.5–5.2)
Sodium: 138 mmol/L (ref 134–144)
Total Protein: 6.1 g/dL (ref 6.0–8.5)
eGFR: 96 mL/min/1.73

## 2024-02-09 LAB — LDL CHOLESTEROL, DIRECT: LDL Direct: 103 mg/dL — ABNORMAL HIGH (ref 0–99)

## 2024-02-09 LAB — LIPOPROTEIN A (LPA): Lipoprotein (a): 43.3 nmol/L

## 2024-02-10 ENCOUNTER — Other Ambulatory Visit: Payer: Self-pay

## 2024-02-10 ENCOUNTER — Ambulatory Visit: Payer: Self-pay | Admitting: Cardiology

## 2024-02-10 DIAGNOSIS — E782 Mixed hyperlipidemia: Secondary | ICD-10-CM

## 2024-02-10 MED ORDER — PRAVASTATIN SODIUM 40 MG PO TABS: 40.0000 mg | ORAL_TABLET | Freq: Every evening | ORAL | 3 refills | Status: AC

## 2024-02-13 ENCOUNTER — Encounter (HOSPITAL_BASED_OUTPATIENT_CLINIC_OR_DEPARTMENT_OTHER): Payer: Self-pay

## 2024-02-13 ENCOUNTER — Ambulatory Visit (HOSPITAL_BASED_OUTPATIENT_CLINIC_OR_DEPARTMENT_OTHER): Admit: 2024-02-13 | Discharge: 2024-02-13 | Disposition: A | Admitting: Radiology

## 2024-02-13 ENCOUNTER — Ambulatory Visit (HOSPITAL_BASED_OUTPATIENT_CLINIC_OR_DEPARTMENT_OTHER)
Admission: EM | Admit: 2024-02-13 | Discharge: 2024-02-13 | Disposition: A | Discharge status: Home / Self Care | Attending: Family Medicine | Admitting: Family Medicine

## 2024-02-13 DIAGNOSIS — R059 Cough, unspecified: Secondary | ICD-10-CM | POA: Diagnosis not present

## 2024-02-13 DIAGNOSIS — R051 Acute cough: Secondary | ICD-10-CM

## 2024-02-13 DIAGNOSIS — R079 Chest pain, unspecified: Secondary | ICD-10-CM | POA: Diagnosis not present

## 2024-02-13 DIAGNOSIS — R0602 Shortness of breath: Secondary | ICD-10-CM

## 2024-02-13 DIAGNOSIS — U071 COVID-19: Secondary | ICD-10-CM

## 2024-02-13 LAB — POC SOFIA SARS ANTIGEN FIA: SARS Coronavirus 2 Ag: POSITIVE — AB

## 2024-02-13 NOTE — ED Triage Notes (Signed)
 States onset of headache Tuesday. Patient reports runny nose, productive cough, chills without fever, and lump to right side of neck. Denies sore throat. No tylenol  or ibuprofen today. Has not tried any otc remedies. Decreased appetite

## 2024-02-13 NOTE — Discharge Instructions (Signed)
 Your COVID test was positive.  Your chest x-ray was normal.  It can take 7 to 10 days for your symptoms to improve or resolve.  Recommend over-the-counter medications to include Tylenol , ibuprofen for pain, body aches and inflammation. You can take Mucinex for cough and congestion. Follow-up with your doctor as needed

## 2024-02-13 NOTE — ED Provider Notes (Signed)
 [facility]    CSN: [id number] Arrival date & time: 02/13/24  1122      History   Chief Complaint Chief Complaint  Patient presents with   Headache   Cough   Nasal Congestion    HPI Nancy Chavez is a 68 y.o. female.   States onset of headache Tuesday. Patient reports runny nose, productive cough, chills without fever, and lump to right side of neck. Denies sore throat. No tylenol  or ibuprofen today. Has not tried any otc remedies. Decreased appetite    Headache Associated symptoms: cough   Cough Associated symptoms: headaches     Past Medical History:  Diagnosis Date   Acquired spondylolisthesis 04/11/2012   Overview:  L4/5   ACS (acute coronary syndrome) (HCC) 03/13/2022   Allergy    seasonal allergies   Anxiety 07/20/2017   on meds   Asymptomatic microscopic hematuria 12/09/2016   Backache 04/11/2012   Benzodiazepine dependence (HCC) 07/12/2017   Brachial radiculitis 02/10/2012   Cancer (HCC)    Carpal tunnel syndrome of right wrist 09/03/2014   Cerebral artery occlusion with cerebral infarction (HCC) 08/10/2017   to stop smoking,  d/c coumadin,  begin asa qd   Cerebral artery occlusion with cerebral infarction (HCC) 2002/2016   Chronic back pain    Chronic pain syndrome 01/04/2024   Chronic radicular cervical pain 08/10/2017   followed by neurology   Colitis 08/10/2017   continue meds per ER   Cyst of thyroid  08/10/2017   Diverticulosis 08/10/2017   Drug withdrawal syndrome (HCC) 07/12/2017   Dysphagia 05/19/2017   Elevated glucose 08/10/2017   Epigastric pain 07/29/2017   Esophagitis 03/13/2022   Essential tremor 01/04/2024   Gastroesophageal reflux disease without esophagitis 08/10/2017   diet controlled as of 05/27/2020   Hiatal hernia 08/10/2017   History of colon polyps 08/10/2017   History of CVA (cerebrovascular accident) 12/24/2014   Formatting of this note might be different from the original. 2006   HLD (hyperlipidemia)  08/10/2017   Hormone replacement therapy (HRT) 06/17/2017   Hx-TIA (transient ischemic attack) 12/24/2014   Hypercalcemia 08/10/2017   Insomnia 07/20/2017   Low thyroid  stimulating hormone (TSH) level 01/20/2018   Lumbar radiculopathy, chronic 08/10/2017   Formatting of this note might be different from the original. followed by neurology   MDD (major depressive disorder), recurrent severe, without psychosis (HCC) 09/03/2020   Menopause 08/10/2017   script for prevo   Mixed dyslipidemia 08/10/2017   at goal,  continue low fat low cholesterol diet.   Mixed emotional features as adjustment reaction 08/10/2017   Mixed hyperlipidemia 08/10/2017   on meds   Neck pain 08/10/2017   Prednisone 20mg  2 qd x7d and Lortab 5mg  q4hrs prn #40. If pain persists or worsens may need MRI.   Non-refractory chronic migraine without aura 01/04/2024   Osteoporosis 2021   confirmed by bone density testing   Pain in joint, pelvic region and thigh 04/11/2012   Palpitations 06/21/2019   Precordial pain 06/21/2019   Primary osteoarthritis of first carpometacarpal joint of right hand 09/03/2014   generalized arthritis   PVC (premature ventricular contraction) 06/21/2019   Recurrent nephrolithiasis 12/13/2016   Renal cyst 12/13/2016   Restless legs syndrome 01/04/2024   S/P lumbar fusion 05/13/2016   Seizures (HCC) 2018   Shortness of breath 06/21/2019   Situational mixed anxiety and depressive disorder 08/10/2017   on meds   Spinal stenosis of lumbar region without neurogenic claudication 04/11/2012   Overview:  L4/5  Squamous cell carcinoma of right lower leg    Stress incontinence, female 08/10/2017   Takotsubo cardiomyopathy 03/13/2022   Thoracic or lumbosacral neuritis or radiculitis 04/11/2012   Overview:  L4-S1 proven by EMG ICD-10 cut over    Tobacco abuse 08/10/2017   stopped smoking 8 weeks ago-  currently taking chantix   Transient ischemic attack (TIA)    Type 2 diabetes mellitus with  hyperglycemia, without long-term current use of insulin (HCC) 08/10/2017   A1C at goal,  continue ADA diet     Formatting of this note might be different from the original.  A1C at goal,  continue ADA diet     Weight loss, unintentional 01/20/2018    Patient Active Problem List   Diagnosis Date Noted   Chronic back pain    Chronic pain syndrome 01/04/2024   Essential tremor 01/04/2024   Non-refractory chronic migraine without aura 01/04/2024   Restless legs syndrome 01/04/2024   Squamous cell carcinoma of right lower leg 06/29/2022   Transient ischemic attack (TIA) 06/29/2022   Cancer (HCC) 06/29/2022   Allergy 06/29/2022   ACS (acute coronary syndrome) (HCC) 03/13/2022   Takotsubo cardiomyopathy 03/13/2022   Esophagitis 03/13/2022   MDD (major depressive disorder), recurrent severe, without psychosis (HCC) 09/03/2020   Precordial pain 06/21/2019   Shortness of breath 06/21/2019   Palpitations 06/21/2019   PVC (premature ventricular contraction) 06/21/2019   Osteoporosis 2021   Low thyroid  stimulating hormone (TSH) level 01/20/2018   Weight loss, unintentional 01/20/2018   Cerebral artery occlusion with cerebral infarction (HCC) 08/10/2017   Chronic radicular cervical pain 08/10/2017   Colitis 08/10/2017   Cyst of thyroid  08/10/2017   Diverticulosis 08/10/2017   Elevated glucose 08/10/2017   Gastroesophageal reflux disease without esophagitis 08/10/2017   Hiatal hernia 08/10/2017   History of colon polyps 08/10/2017   Hypercalcemia 08/10/2017   Menopause 08/10/2017   Mixed dyslipidemia 08/10/2017   Mixed emotional features as adjustment reaction 08/10/2017   HLD (hyperlipidemia) 08/10/2017   Neck pain 08/10/2017   Situational mixed anxiety and depressive disorder 08/10/2017   Stress incontinence, female 08/10/2017   Tobacco abuse 08/10/2017   Type 2 diabetes mellitus with hyperglycemia, without long-term current use of insulin (HCC) 08/10/2017   Lumbar radiculopathy,  chronic 08/10/2017   Mixed hyperlipidemia 08/10/2017   Epigastric pain 07/29/2017   Anxiety 07/20/2017   Insomnia 07/20/2017   Benzodiazepine dependence (HCC) 07/12/2017   Drug withdrawal syndrome (HCC) 07/12/2017   Hormone replacement therapy (HRT) 06/17/2017   Dysphagia 05/19/2017   Recurrent nephrolithiasis 12/13/2016   Renal cyst 12/13/2016   Asymptomatic microscopic hematuria 12/09/2016   S/P lumbar fusion 05/13/2016   Seizures (HCC) 2018   History of CVA (cerebrovascular accident) 12/24/2014   Hx-TIA (transient ischemic attack) 12/24/2014   Carpal tunnel syndrome of right wrist 09/03/2014   Primary osteoarthritis of first carpometacarpal joint of right hand 09/03/2014   Acquired spondylolisthesis 04/11/2012   Backache 04/11/2012   Pain in joint, pelvic region and thigh 04/11/2012   Spinal stenosis of lumbar region without neurogenic claudication 04/11/2012   Thoracic or lumbosacral neuritis or radiculitis 04/11/2012   Brachial radiculitis 02/10/2012    Past Surgical History:  Procedure Laterality Date   BACK SURGERY  2014   CHOLECYSTECTOMY     COLONOSCOPY     FOOT SURGERY Right    HIP SURGERY Bilateral 2020/2021   Hip replacement   RIGHT/LEFT HEART CATH AND CORONARY ANGIOGRAPHY N/A 03/13/2022   Procedure: RIGHT/LEFT HEART CATH AND CORONARY ANGIOGRAPHY;  Surgeon: Thukkani, Arun K, MD;  Location: [facility] INVASIVE CV LAB;  Service: Cardiovascular;  Laterality: N/A;   TONSILLECTOMY     TOTAL ABDOMINAL HYSTERECTOMY W/ BILATERAL SALPINGOOPHORECTOMY  1984   WISDOM TOOTH EXTRACTION      OB History   No obstetric history on file.      Home Medications    Prior to Admission medications   Medication Sig Start Date End Date Taking? Authorizing Provider  buPROPion (WELLBUTRIN XL) 150 MG 24 hr tablet Take 150 mg by mouth daily.    [provider]  carvedilol  (COREG ) 6.25 MG tablet TAKE 1 TABLET BY MOUTH TWICE DAILY- HOLD IF SYSTOLIC BLOOD PRESSURE IS<100 02/08/24    Carlin Delon BROCKS, NP  diazepam (VALIUM) 5 MG tablet Take 2.5 mg by mouth every 8 (eight) hours as needed for anxiety. 02/26/22   [provider]  gabapentin (NEURONTIN) 300 MG capsule Take 600-1,200 mg by mouth See admin instructions. Take 600 mg (2 capsules) by mouth every morning and 1,200 mg (4 capsules) at bedtime. 03/31/19   [provider]  HYDROcodone-acetaminophen  (NORCO) 10-325 MG tablet Take 1.5 tablets by mouth every 8 (eight) hours as needed for moderate pain.    [provider]  pravastatin  (PRAVACHOL ) 40 MG tablet Take 1 tablet (40 mg total) by mouth every evening. 02/10/24   Carlin Delon BROCKS, NP  traZODone (DESYREL) 150 MG tablet Take 150 mg by mouth at bedtime.    [provider]    Family History Family History  Problem Relation Age of Onset   Heart failure Mother    Cancer Sister    Diabetes Brother    Cancer Brother    Colon polyps Brother 36   Cancer Brother    Colon cancer Neg Hx    Esophageal cancer Neg Hx    Stomach cancer Neg Hx    Rectal cancer Neg Hx     Social History Social History   Tobacco Use   Smoking status: Every Day    Types: E-cigarettes   Smokeless tobacco: Never   Tobacco comments:    Started vaping 8 months and when she quit cigarettes-09/29/2023        Still currently vapes KRD 01/18/2024  Vaping Use   Vaping status: Every Day  Substance Use Topics   Alcohol use: No   Drug use: Not Currently    Types: Marijuana     Allergies   Oxycodone, Metoprolol  succinate [metoprolol ], and Morphine   Review of Systems Review of Systems  Respiratory:  Positive for cough.   Neurological:  Positive for headaches.     Physical Exam Triage Vital Signs ED Triage Vitals  Encounter Vitals Group     BP 02/13/24 1228 (!) 146/83     Girls Systolic BP Percentile --      Girls Diastolic BP Percentile --      Boys Systolic BP Percentile --      Boys Diastolic BP Percentile --      Pulse Rate 02/13/24 1228 84      Resp 02/13/24 1228 20     Temp 02/13/24 1228 (!) 97.5 F (36.4 C)     Temp Source 02/13/24 1228 Oral     SpO2 02/13/24 1228 97 %     Weight --      Height --      Head Circumference --      Peak Flow --      Pain Score 02/13/24 1230 5  Pain Loc --      Pain Education --      Exclude from Growth Chart --    No data found.  Updated Vital Signs BP (!) 146/83 (BP Location: Right Arm)   Pulse 84   Temp (!) 97.5 F (36.4 C) (Oral)   Resp 20   SpO2 97%   Visual Acuity Right Eye Distance:   Left Eye Distance:   Bilateral Distance:    Right Eye Near:   Left Eye Near:    Bilateral Near:     Physical Exam   UC Treatments / Results  Labs (all labs ordered are listed, but only abnormal results are displayed) Labs Reviewed  POC SOFIA SARS ANTIGEN FIA - Abnormal; Notable for the following components:      Result Value   SARS Coronavirus 2 Ag Positive (*)    All other components within normal limits    EKG   Radiology No results found.  Procedures Procedures (including critical care time)  Medications Ordered in UC Medications - No data to display  Initial Impression / Assessment and Plan / UC Course  I have reviewed the triage vital signs and the nursing notes.  Pertinent labs & imaging results that were available during my care of the patient were reviewed by me and considered in my medical decision making (see chart for details).     *** Final Clinical Impressions(s) / UC Diagnoses   Final diagnoses:  Acute cough   Discharge Instructions   None    ED Prescriptions   None    PDMP not reviewed this encounter.

## 2024-03-13 DIAGNOSIS — E782 Mixed hyperlipidemia: Secondary | ICD-10-CM | POA: Diagnosis not present

## 2024-03-14 LAB — HEPATIC FUNCTION PANEL
ALT: 10 IU/L (ref 0–32)
AST: 14 IU/L (ref 0–40)
Albumin: 4.4 g/dL (ref 3.9–4.9)
Alkaline Phosphatase: 90 IU/L (ref 49–135)
Bilirubin Total: 0.2 mg/dL (ref 0.0–1.2)
Bilirubin, Direct: 0.09 mg/dL (ref 0.00–0.40)
Total Protein: 6.5 g/dL (ref 6.0–8.5)

## 2024-03-14 LAB — LIPID PANEL
Chol/HDL Ratio: 2.1 ratio (ref 0.0–4.4)
Cholesterol, Total: 135 mg/dL (ref 100–199)
HDL: 65 mg/dL
LDL Chol Calc (NIH): 59 mg/dL (ref 0–99)
Triglycerides: 46 mg/dL (ref 0–149)
VLDL Cholesterol Cal: 11 mg/dL (ref 5–40)

## 2024-03-14 LAB — APOLIPOPROTEIN B: Apolipoprotein B: 56 mg/dL

## 2024-03-15 ENCOUNTER — Ambulatory Visit: Payer: Self-pay | Admitting: Cardiology

## 2024-03-15 NOTE — Telephone Encounter (Signed)
 Patient was called and notified of Jennifer's updates in her myChart. Patient made aware of the message that was entered Cholesterol looks great, LDL is 59 this is the desirable range. Her APO B is normal which lets us  know that we can truly trust that her cholesterol is well-controlled. Her liver function testing is normal. Continue current medications.  Patient appreciated the call and denied any other questions/concerns.

## 2024-04-12 ENCOUNTER — Telehealth: Payer: Self-pay

## 2024-04-12 NOTE — Telephone Encounter (Signed)
 Attempted to reach patient concerning colonoscopy recall; unable to speak with patient;  left message and number to the office for patient to call back and schedule appts;

## 2024-04-14 NOTE — Telephone Encounter (Signed)
 Attempted to reach patient concerning colonoscopy recall; unable to speak with patient;  left message and number to the office for patient to call back and schedule appts;

## 2024-04-26 NOTE — Telephone Encounter (Signed)
 Attempted to reach patient concerning colonoscopy recall; unable to speak with patient;  left message and number to the office for patient to call back and schedule appts;

## 2024-05-30 ENCOUNTER — Encounter: Payer: Self-pay | Admitting: Gastroenterology

## 2024-05-31 NOTE — Telephone Encounter (Signed)
 Patient called back to the office and scheduled on OV to see provider;

## 2024-06-26 ENCOUNTER — Ambulatory Visit: Admitting: Gastroenterology
